# Patient Record
Sex: Female | Born: 1965
Health system: Southern US, Community
[De-identification: ages and names within clinical notes are randomized; demographics above are authoritative.]

## PROBLEM LIST (undated history)

## (undated) DIAGNOSIS — M722 Plantar fascial fibromatosis: Secondary | ICD-10-CM

## (undated) DIAGNOSIS — E119 Type 2 diabetes mellitus without complications: Secondary | ICD-10-CM

## (undated) DIAGNOSIS — E785 Hyperlipidemia, unspecified: Secondary | ICD-10-CM

## (undated) DIAGNOSIS — M109 Gout, unspecified: Secondary | ICD-10-CM

## (undated) DIAGNOSIS — I10 Essential (primary) hypertension: Secondary | ICD-10-CM

## (undated) HISTORY — PX: OTHER SURGICAL HISTORY: SHX169

## (undated) HISTORY — PX: TUBAL LIGATION: SHX77

## (undated) HISTORY — PX: LYMPH NODE DISSECTION: SHX5087

---

## 2013-09-08 ENCOUNTER — Encounter (HOSPITAL_BASED_OUTPATIENT_CLINIC_OR_DEPARTMENT_OTHER): Payer: Self-pay | Admitting: Emergency Medicine

## 2013-09-08 ENCOUNTER — Emergency Department (HOSPITAL_BASED_OUTPATIENT_CLINIC_OR_DEPARTMENT_OTHER)
Admission: EM | Admit: 2013-09-08 | Discharge: 2013-09-08 | Disposition: A | Discharge status: Home / Self Care | Payer: 59 | Attending: Emergency Medicine | Admitting: Emergency Medicine

## 2013-09-08 DIAGNOSIS — I10 Essential (primary) hypertension: Secondary | ICD-10-CM | POA: Diagnosis not present

## 2013-09-08 DIAGNOSIS — H9209 Otalgia, unspecified ear: Secondary | ICD-10-CM | POA: Diagnosis present

## 2013-09-08 DIAGNOSIS — R229 Localized swelling, mass and lump, unspecified: Secondary | ICD-10-CM | POA: Diagnosis not present

## 2013-09-08 HISTORY — DX: Essential (primary) hypertension: I10

## 2013-09-08 MED ORDER — IBUPROFEN 800 MG PO TABS
800.0000 mg | ORAL_TABLET | Freq: Once | ORAL | Status: AC
  Administered 2013-09-08: 800 mg via ORAL
  Filled 2013-09-08: qty 1

## 2013-09-08 MED ORDER — SULFAMETHOXAZOLE-TRIMETHOPRIM 800-160 MG PO TABS
1.0000 | ORAL_TABLET | Freq: Two times a day (BID) | ORAL | Status: DC
Start: 2013-09-08 — End: 2015-12-10

## 2013-09-08 MED ORDER — OXYCODONE-ACETAMINOPHEN 5-325 MG PO TABS
2.0000 | ORAL_TABLET | Freq: Once | ORAL | Status: AC
  Administered 2013-09-08: 2 via ORAL
  Filled 2013-09-08: qty 2

## 2013-09-08 MED ORDER — OXYCODONE-ACETAMINOPHEN 5-325 MG PO TABS: 2.0000 | ORAL_TABLET | Freq: Every evening | ORAL | Status: DC | PRN

## 2013-09-08 NOTE — ED Notes (Signed)
Pt states left ear pain since yesterday. Pt states she also has a swollen area behind her left ear that is painful to touch and the pain radiates to her jaw.

## 2013-09-08 NOTE — ED Provider Notes (Signed)
CSN: [id number]     Arrival date & time 09/08/13  2136 History   First MD Initiated Contact with Patient 09/08/13 2315     Chief Complaint  Patient presents with  . Otalgia     (Consider location/radiation/quality/duration/timing/severity/associated sxs/prior Treatment) HPI 48 year old female with 2 day history of gradual onset gradually worsening tender painful nodule behind her left ear with no trauma no hearing loss no rash no ear drainage no headache no neck pain no sore throat no confusion no treatment prior to arrival; pain was initially mild and is now moderately severe worse with palpation without radiation or associated symptoms. Past Medical History  Diagnosis Date  . Hypertension    Past Surgical History  Procedure Laterality Date  . Ablation     History reviewed. No pertinent family history. History  Substance Use Topics  . Smoking status: Never Smoker   . Smokeless tobacco: Not on file  . Alcohol Use: No   OB History   Grav Para Term Preterm Abortions TAB SAB Ect Mult Living                 Review of Systems 10 Systems reviewed and are negative for acute change except as noted in the HPI.   Allergies  Review of patient's allergies indicates no known allergies.  Home Medications   Prior to Admission medications   Medication Sig Start Date End Date Taking? Authorizing Provider  triamterene-hydrochlorothiazide (DYAZIDE) 50-25 MG per capsule Take 1 capsule by mouth every morning.   Yes Historical Provider, MD  oxyCODONE-acetaminophen (PERCOCET) 5-325 MG per tablet Take 2 tablets by mouth at bedtime as needed for severe pain. 09/08/13   Hurman HornJohn M Judah Chevere, MD  sulfamethoxazole-trimethoprim (BACTRIM DS,SEPTRA DS) 800-160 MG per tablet Take 1 tablet by mouth 2 (two) times daily. X 7 days 09/08/13   Hurman HornJohn M Ysidra Sopher, MD   BP 185/97  Pulse 93  Temp(Src) 98.3 F (36.8 C) (Oral)  Resp 16  Ht 5\' 4"  (1.626 m)  Wt 220 lb 3 oz (99.876 kg)  BMI 37.78 kg/m2  SpO2  97% Physical Exam  Nursing note and vitals reviewed. Constitutional:  Awake, alert, nontoxic appearance.  HENT:  Head: Atraumatic.  Right Ear: External ear normal.  Left Ear: External ear normal.  Mouth/Throat: Oropharynx is clear and moist. No oropharyngeal exudate.  Bilateral tympanic membranes appear normal external auditory canals are clear bilaterally bilateral ears are nontender left posterior auricular area just posterior to the inferior aspect of left ear reveals tender subcutaneous nodule firm approximately 1 cm diameter without overlying erythema or excessive warmth or fluctuance or purulence drainage to suggest abscess requiring incision and drainage at this time; mastoid region is nontender  Eyes: Right eye exhibits no discharge. Left eye exhibits no discharge.  Neck: Neck supple.  Cardiovascular: Normal rate and regular rhythm.   No murmur heard. Pulmonary/Chest: Effort normal and breath sounds normal. No respiratory distress. She has no wheezes. She has no rales. She exhibits no tenderness.  Abdominal: Soft. Bowel sounds are normal. She exhibits no distension. There is no tenderness. There is no rebound.  Musculoskeletal: She exhibits no tenderness.  Baseline ROM, no obvious new focal weakness.  Lymphadenopathy:    She has no cervical adenopathy.  Neurological: She is alert.  Mental status and motor strength appears baseline for patient and situation.  Skin: No rash noted.  Psychiatric: She has a normal mood and affect.    ED Course  Procedures (including critical care time) Patient informed of  clinical course, understand medical decision-making process, and agree with plan. Labs Review Labs Reviewed - No data to display  Imaging Review No results found.   EKG Interpretation None      MDM   Final diagnoses:  Skin nodule    I doubt any other EMC precluding discharge at this time including, but not necessarily limited to the following:mastoiditis,  meningitis, abscess needing emergent I&D.    Hurman HornJohn M Daiwik Buffalo, MD 09/10/13 [phone]

## 2013-09-08 NOTE — Discharge Instructions (Signed)
Return sooner if you develop fever, headache, confusion, stiff neck, rash, pus drainage, decreased hearing, or other concerns.

## 2013-09-08 NOTE — ED Notes (Signed)
C/o left ear pain

## 2013-09-08 NOTE — ED Notes (Addendum)
Pt offered ibuprofen in triage. Pt states she will wait until she gets to the back for the medication.

## 2014-08-11 ENCOUNTER — Encounter (HOSPITAL_BASED_OUTPATIENT_CLINIC_OR_DEPARTMENT_OTHER): Payer: Self-pay | Admitting: *Deleted

## 2014-08-11 ENCOUNTER — Emergency Department (HOSPITAL_BASED_OUTPATIENT_CLINIC_OR_DEPARTMENT_OTHER): Payer: 59

## 2014-08-11 ENCOUNTER — Emergency Department (HOSPITAL_BASED_OUTPATIENT_CLINIC_OR_DEPARTMENT_OTHER)
Admission: EM | Admit: 2014-08-11 | Discharge: 2014-08-11 | Disposition: A | Discharge status: Home / Self Care | Payer: 59 | Attending: Emergency Medicine | Admitting: Emergency Medicine

## 2014-08-11 DIAGNOSIS — M722 Plantar fascial fibromatosis: Secondary | ICD-10-CM | POA: Diagnosis not present

## 2014-08-11 DIAGNOSIS — I10 Essential (primary) hypertension: Secondary | ICD-10-CM | POA: Diagnosis not present

## 2014-08-11 DIAGNOSIS — Z792 Long term (current) use of antibiotics: Secondary | ICD-10-CM | POA: Diagnosis not present

## 2014-08-11 DIAGNOSIS — M79671 Pain in right foot: Secondary | ICD-10-CM | POA: Diagnosis present

## 2014-08-11 MED ORDER — IBUPROFEN 400 MG PO TABS
400.0000 mg | ORAL_TABLET | Freq: Once | ORAL | Status: AC
  Administered 2014-08-11: 400 mg via ORAL
  Filled 2014-08-11: qty 1

## 2014-08-11 NOTE — ED Notes (Addendum)
Reports right foot pain since this morning- denies injury- similar sx 3-4 years ago

## 2014-08-11 NOTE — ED Provider Notes (Signed)
CSN: [id number]     Arrival date & time 08/11/14  0857 History   First MD Initiated Contact with Patient 08/11/14 [phone]     Chief Complaint  Patient presents with  . Foot Pain     (Consider location/radiation/quality/duration/timing/severity/associated sxs/prior Treatment) HPI    Jessica Robertson is a 49 y.o. female complaining of severe pain to the sole of the left foot onset this morning when she woke up there was no trauma. She rates her pain at 10 out of 10, and is exacerbated by weightbearing. She is taking no pain medication prior to arrival. Patient is ambulatory at her baseline. That she had a similar incidence 3-4 years ago.   Past Medical History  Diagnosis Date  . Hypertension    Past Surgical History  Procedure Laterality Date  . Ablation    . Lymph node dissection    . Tubal ligation     No family history on file. History  Substance Use Topics  . Smoking status: Never Smoker   . Smokeless tobacco: Never Used  . Alcohol Use: No   OB History    No data available     Review of Systems  10 systems reviewed and found to be negative, except as noted in the HPI.   Allergies  Review of patient's allergies indicates no known allergies.  Home Medications   Prior to Admission medications   Medication Sig Start Date End Date Taking? Authorizing Provider  triamterene-hydrochlorothiazide (DYAZIDE) 50-25 MG per capsule Take 1 capsule by mouth every morning.   Yes Historical Provider, MD  oxyCODONE-acetaminophen (PERCOCET) 5-325 MG per tablet Take 2 tablets by mouth at bedtime as needed for severe pain. 09/08/13   Wayland SalinasJohn Bednar, MD  sulfamethoxazole-trimethoprim (BACTRIM DS,SEPTRA DS) 800-160 MG per tablet Take 1 tablet by mouth 2 (two) times daily. X 7 days 09/08/13   Wayland SalinasJohn Bednar, MD   BP 176/80 mmHg  Pulse 94  Temp(Src) 98.1 F (36.7 C) (Oral)  Resp 20  Ht 5\' 6"  (1.676 m)  Wt 230 lb (104.327 kg)  BMI 37.14 kg/m2  SpO2 97% Physical Exam  Constitutional: She is  oriented to person, place, and time. She appears well-developed and well-nourished. No distress.  HENT:  Head: Normocephalic.  Eyes: Conjunctivae and EOM are normal.  Cardiovascular: Normal rate.   Pulmonary/Chest: Effort normal. No stridor.  Musculoskeletal: Normal range of motion. She exhibits tenderness.  Patient is tender to palpation along the dorsum of the right foot. Neurovascularly intact, no deformity excellent range of motion to ankle and toes although it elicits pain.  Neurological: She is alert and oriented to person, place, and time.  Psychiatric: She has a normal mood and affect.  Nursing note and vitals reviewed.   ED Course  Procedures (including critical care time) Labs Review Labs Reviewed - No data to display  Imaging Review Dg Foot Complete Right  08/11/2014   CLINICAL DATA:  pain in the heel, anterior foot and in the ankle since this am with no injury  EXAM: RIGHT FOOT COMPLETE - 3+ VIEW  COMPARISON:  None.  FINDINGS: There is no evidence of fracture or dislocation. No significant osseous degenerative change. Accessory ossicles. There is no evidence of arthropathy or other focal bone abnormality. Soft tissues are unremarkable.  IMPRESSION: Negative.   Electronically Signed   By: Corlis Leak  Hassell M.D.   On: 08/11/2014 09:49     EKG Interpretation None      MDM   Final diagnoses:  Plantar fasciitis of  right foot    Filed Vitals:   08/11/14 0901  BP: 176/80  Pulse: 94  Temp: 98.1 F (36.7 C)  TempSrc: Oral  Resp: 20  Height:  (1.676 m)  Weight: 230 lb (104.327 kg)  SpO2: 97%    Medications  ibuprofen (ADVIL,MOTRIN) tablet 400 mg (not administered)    Jessica Robertson is a pleasant 49 y.o. female presenting with near atraumatic pain to dorsum of right foot, x-rays negative, patient is neurovascularly intact, pain is exacerbated by weightbearing. This is consistent with a plantar fasciitis. Advised patient to perform stretching exercises and NSAIDs for  their anti-inflammatory pain control properties. I've offered this patient crutches and she has declined. [facility] referral given.  Evaluation does not show pathology that would require ongoing emergent intervention or inpatient treatment. Pt is hemodynamically stable and mentating appropriately. Discussed findings and plan with patient/guardian, who agrees with care plan. All questions answered. Return precautions discussed and outpatient follow up given.      Wynetta Emery, PA-C 08/11/14 8119  Elwin Mocha, MD 08/11/14 [phone]

## 2014-08-11 NOTE — Discharge Instructions (Signed)
For pain control please take ibuprofen (also known as Motrin or Advil) 800mg  (this is normally 4 over the counter pills) 3 times a day  for 5 days. Take with food to minimize stomach irritation.  Please follow with your primary care doctor in the next 2 days for a check-up. They must obtain records for further management.   Do not hesitate to return to the Emergency Department for any new, worsening or concerning symptoms.    Plantar Fasciitis Plantar fasciitis is a common condition that causes foot pain. It is soreness (inflammation) of the band of tough fibrous tissue on the bottom of the foot that runs from the heel bone (calcaneus) to the ball of the foot. The cause of this soreness may be from excessive standing, poor fitting shoes, running on hard surfaces, being overweight, having an abnormal walk, or overuse (this is common in runners) of the painful foot or feet. It is also common in aerobic exercise dancers and ballet dancers. SYMPTOMS  Most people with plantar fasciitis complain of:  Severe pain in the morning on the bottom of their foot especially when taking the first steps out of bed. This pain recedes after a few minutes of walking.  Severe pain is experienced also during walking following a long period of inactivity.  Pain is worse when walking barefoot or up stairs DIAGNOSIS   Your caregiver will diagnose this condition by examining and feeling your foot.  Special tests such as X-rays of your foot, are usually not needed. PREVENTION   Consult a sports medicine professional before beginning a new exercise program.  Walking programs offer a good workout. With walking there is a lower chance of overuse injuries common to runners. There is less impact and less jarring of the joints.  Begin all new exercise programs slowly. If problems or pain develop, decrease the amount of time or distance until you are at a comfortable level.  Wear good shoes and replace them  regularly.  Stretch your foot and the heel cords at the back of the ankle (Achilles tendon) both before and after exercise.  Run or exercise on even surfaces that are not hard. For example, asphalt is better than pavement.  Do not run barefoot on hard surfaces.  If using a treadmill, vary the incline.  Do not continue to workout if you have foot or joint problems. Seek professional help if they do not improve. HOME CARE INSTRUCTIONS   Avoid activities that cause you pain until you recover.  Use ice or cold packs on the problem or painful areas after working out.  Only take over-the-counter or prescription medicines for pain, discomfort, or fever as directed by your caregiver.  Soft shoe inserts or athletic shoes with air or gel sole cushions may be helpful.  If problems continue or become more severe, consult a sports medicine caregiver or your own health care provider. Cortisone is a potent anti-inflammatory medication that may be injected into the painful area. You can discuss this treatment with your caregiver. MAKE SURE YOU:   Understand these instructions.  Will watch your condition.  Will get help right away if you are not doing well or get worse. Document Released: 01/04/2001 Document Revised: 07/04/2011 Document Reviewed: 03/05/2008 [facility] Patient Information 2015 [employer] [place]. This information is not intended to replace advice given to you by your health care provider. Make sure you discuss any questions you have with your health care provider.

## 2014-08-11 NOTE — ED Notes (Signed)
D/c with family- follow up care discussed and referral given for Dr Pearletha ForgeHudnall

## 2015-07-14 ENCOUNTER — Emergency Department (HOSPITAL_BASED_OUTPATIENT_CLINIC_OR_DEPARTMENT_OTHER)
Admission: EM | Admit: 2015-07-14 | Discharge: 2015-07-14 | Disposition: A | Discharge status: Home / Self Care | Payer: 59

## 2015-07-14 NOTE — ED Notes (Signed)
Patient called for triage x2, no answer °

## 2015-07-14 NOTE — ED Notes (Signed)
Patient called for triage x1, no answer. 

## 2015-11-30 ENCOUNTER — Emergency Department (HOSPITAL_BASED_OUTPATIENT_CLINIC_OR_DEPARTMENT_OTHER): Payer: Self-pay

## 2015-11-30 ENCOUNTER — Emergency Department (HOSPITAL_BASED_OUTPATIENT_CLINIC_OR_DEPARTMENT_OTHER)
Admission: EM | Admit: 2015-11-30 | Discharge: 2015-11-30 | Disposition: A | Discharge status: Home / Self Care | Payer: Self-pay | Attending: Emergency Medicine | Admitting: Emergency Medicine

## 2015-11-30 ENCOUNTER — Encounter (HOSPITAL_BASED_OUTPATIENT_CLINIC_OR_DEPARTMENT_OTHER): Payer: Self-pay | Admitting: Emergency Medicine

## 2015-11-30 DIAGNOSIS — I10 Essential (primary) hypertension: Secondary | ICD-10-CM | POA: Insufficient documentation

## 2015-11-30 DIAGNOSIS — M10071 Idiopathic gout, right ankle and foot: Secondary | ICD-10-CM | POA: Insufficient documentation

## 2015-11-30 DIAGNOSIS — M109 Gout, unspecified: Secondary | ICD-10-CM

## 2015-11-30 MED ORDER — NAPROXEN 250 MG PO TABS
500.0000 mg | ORAL_TABLET | Freq: Once | ORAL | Status: AC
  Administered 2015-11-30: 500 mg via ORAL
  Filled 2015-11-30: qty 2

## 2015-11-30 MED ORDER — NAPROXEN 500 MG PO TABS: 500.0000 mg | ORAL_TABLET | Freq: Two times a day (BID) | ORAL | 0 refills | Status: DC

## 2015-11-30 MED ORDER — PREDNISONE 20 MG PO TABS: 40.0000 mg | ORAL_TABLET | Freq: Every day | ORAL | 0 refills | Status: DC

## 2015-11-30 MED FILL — predniSONE 20 MG TABS: 20 | 5 days supply | Qty: 10 | Fill #0

## 2015-11-30 MED FILL — NAPROXEN 500 MG TABLET: 500 | 15 days supply | Qty: 30 | Fill #0

## 2015-11-30 NOTE — ED Triage Notes (Signed)
Patient states that she has a hx of plantar fascitis. The patient reports that "that pain went away" yesterday she woke up and her middle toe on her right swollen ( maybe multiple toes) the patient reports that on her left foot "my big toe is feeling some kinda way" When asked to elaborate was not really able.

## 2015-11-30 NOTE — ED Provider Notes (Signed)
[facility] Note   CSN: [id number] Arrival date & time: 11/30/15  1208  First Provider Contact:  First MD Initiated Contact with Patient 11/30/15 1250        History   Chief Complaint Chief Complaint  Patient presents with  . Foot Pain    HPI Jessica Robertson is a 50 y.o. female who presents with complaint complaint of foot pain. She is a past history of plantar fasciitis. Patient states that her symptoms have improved with anti-inflammatory medications, cryotherapy and new insoles. The patient states that she has pain, redness and swelling at the base of her second and third toes which developed yesterday morning when she woke up. She states that it has been worsening. She also complains that her right big toe feels like it needs to pop but developed. She denies fevers, chills, injury to the area, or history of gout. Patient states that she would like something for pain but does not take narcotics and does not like to take shots. She denies numbness, weakness or tingling  HPI  Past Medical History:  Diagnosis Date  . Hypertension     There are no active problems to display for this patient.   Past Surgical History:  Procedure Laterality Date  . ablation    . LYMPH NODE DISSECTION    . TUBAL LIGATION      OB History    No data available       Home Medications    Prior to Admission medications   Medication Sig Start Date End Date Taking? Authorizing Provider  naproxen (NAPROSYN) 500 MG tablet Take 1 tablet (500 mg total) by mouth 2 (two) times daily with a meal. 11/30/15   Arthor Captain, PA-C  predniSONE (DELTASONE) 20 MG tablet Take 2 tablets (40 mg total) by mouth daily. 11/30/15   Arthor Captain, PA-C  sulfamethoxazole-trimethoprim (BACTRIM DS,SEPTRA DS) 800-160 MG per tablet Take 1 tablet by mouth 2 (two) times daily. X 7 days 09/08/13   Wayland Salinas, MD  triamterene-hydrochlorothiazide (DYAZIDE) 50-25 MG per capsule Take 1 capsule by mouth every  morning.    Historical Provider, MD    Family History History reviewed. No pertinent family history.  Social History Social History  Substance Use Topics  . Smoking status: Never Smoker  . Smokeless tobacco: Never Used  . Alcohol use No     Allergies   Review of patient's allergies indicates no known allergies.   Review of Systems Review of Systems Ten systems reviewed and are negative for acute change, except as noted in the HPI.    Physical Exam Updated Vital Signs BP 157/89 (BP Location: Right Arm)   Pulse 89   Temp 98.1 F (36.7 C) (Oral)   Resp 18   Ht  (1.676 m)   Wt 99.8 kg   SpO2 100%   BMI 35.51 kg/m   Physical Exam Physical Exam  Nursing note and vitals reviewed. Constitutional: She is oriented to person, place, and time. She appears well-developed and well-nourished. No distress.  HENT:  Head: Normocephalic and atraumatic.  Eyes: Conjunctivae normal and EOM are normal. Pupils are equal, round, and reactive to light. No scleral icterus.  Neck: Normal range of motion.  Cardiovascular: Normal rate, regular rhythm and normal heart sounds.  Exam reveals no gallop and no friction rub.   No murmur heard. Pulmonary/Chest: Effort normal and breath sounds normal. No respiratory distress.  Abdominal: Soft. Bowel sounds are normal. She exhibits no distension and no  mass. There is no tenderness. There is no guarding.  Musculoskeletal: There is swelling, erythema and tenderness at the base of the second and third toes of the right foot, the digits are not swollen, no swelling in the foot or ankle, no calf tenderness or swelling. Strong DP and PT pulses bilaterally, NVI  Neurological: She is alert and oriented to person, place, and time.  Skin: Skin is warm and dry. She is not diaphoretic.     ED Treatments / Results  Labs (all labs ordered are listed, but only abnormal results are displayed) Labs Reviewed - No data to display  EKG  EKG  Interpretation None       Radiology Dg Foot Complete Right  Result Date: 11/30/2015 CLINICAL DATA:  Right foot pain, second toe pain for 2 days, no known injury EXAM: RIGHT FOOT COMPLETE - 3+ VIEW COMPARISON:  08/11/2014 FINDINGS: Three views of the right foot submitted. No acute fracture or subluxation. There is mild dorsal spurring navicular bone. Minimal spurring distal aspect first metatarsal. IMPRESSION: No acute fracture or subluxation.  Mild degenerative changes. Electronically Signed   By: Natasha MeadLiviu  Pop M.D.   On: 11/30/2015 13:17    Procedures Procedures (including critical care time)  Medications Ordered in ED Medications  naproxen (NAPROSYN) tablet 500 mg (500 mg Oral Given 11/30/15 1331)     Initial Impression / Assessment and Plan / ED Course  I have reviewed the triage vital signs and the nursing notes.  Pertinent labs & imaging results that were available during my care of the patient were reviewed by me and considered in my medical decision making (see chart for details).  Clinical Course    Pt presents with monoarticular pain, swelling and erythema.  Pt is afebrile and stable. Imaging reviewed, no evidence of occult fracture or injury.  Discussed that pt should respond to treatment with in 24 hour of begining treatment & likely resolve in 2-3 days.    Final Clinical Impressions(s) / ED Diagnoses   Final diagnoses:  Acute gout of right foot, unspecified cause    New Prescriptions Discharge Medication List as of 11/30/2015  1:30 PM    START taking these medications   Details  naproxen (NAPROSYN) 500 MG tablet Take 1 tablet (500 mg total) by mouth 2 (two) times daily with a meal., Starting Mon 11/30/2015, Print    predniSONE (DELTASONE) 20 MG tablet Take 2 tablets (40 mg total) by mouth daily., Starting Mon 11/30/2015, Print         WhitewoodAbigail Tyshika Baldridge, PA-C 11/30/15 1650    Geoffery Lyonsouglas Delo, MD 12/01/15 [phone]

## 2015-12-10 ENCOUNTER — Emergency Department (HOSPITAL_BASED_OUTPATIENT_CLINIC_OR_DEPARTMENT_OTHER): Payer: Self-pay

## 2015-12-10 ENCOUNTER — Encounter (HOSPITAL_BASED_OUTPATIENT_CLINIC_OR_DEPARTMENT_OTHER): Payer: Self-pay | Admitting: Emergency Medicine

## 2015-12-10 ENCOUNTER — Observation Stay (HOSPITAL_BASED_OUTPATIENT_CLINIC_OR_DEPARTMENT_OTHER)
Admission: EM | Admit: 2015-12-10 | Discharge: 2015-12-12 | Disposition: A | Discharge status: Home / Self Care | Payer: Self-pay | Attending: Surgery | Admitting: Surgery

## 2015-12-10 DIAGNOSIS — Z6837 Body mass index (BMI) 37.0-37.9, adult: Secondary | ICD-10-CM | POA: Insufficient documentation

## 2015-12-10 DIAGNOSIS — I10 Essential (primary) hypertension: Secondary | ICD-10-CM | POA: Diagnosis present

## 2015-12-10 DIAGNOSIS — K8000 Calculus of gallbladder with acute cholecystitis without obstruction: Secondary | ICD-10-CM | POA: Diagnosis present

## 2015-12-10 DIAGNOSIS — E785 Hyperlipidemia, unspecified: Secondary | ICD-10-CM | POA: Diagnosis present

## 2015-12-10 DIAGNOSIS — K219 Gastro-esophageal reflux disease without esophagitis: Secondary | ICD-10-CM | POA: Insufficient documentation

## 2015-12-10 DIAGNOSIS — K8010 Calculus of gallbladder with chronic cholecystitis without obstruction: Principal | ICD-10-CM | POA: Insufficient documentation

## 2015-12-10 DIAGNOSIS — Z419 Encounter for procedure for purposes other than remedying health state, unspecified: Secondary | ICD-10-CM

## 2015-12-10 DIAGNOSIS — R1013 Epigastric pain: Secondary | ICD-10-CM

## 2015-12-10 DIAGNOSIS — M109 Gout, unspecified: Secondary | ICD-10-CM | POA: Insufficient documentation

## 2015-12-10 HISTORY — DX: Hyperlipidemia, unspecified: E78.5

## 2015-12-10 HISTORY — DX: Gout, unspecified: M10.9

## 2015-12-10 HISTORY — DX: Plantar fascial fibromatosis: M72.2

## 2015-12-10 LAB — SURGICAL PCR SCREEN
MRSA, PCR: NEGATIVE
STAPHYLOCOCCUS AUREUS: NEGATIVE

## 2015-12-10 LAB — COMPREHENSIVE METABOLIC PANEL
ALK PHOS: 133 U/L — AB (ref 38–126)
ALT: 220 U/L — AB (ref 14–54)
AST: 63 U/L — AB (ref 15–41)
Albumin: 3.5 g/dL (ref 3.5–5.0)
Anion gap: 4 — ABNORMAL LOW (ref 5–15)
BUN: 12 mg/dL (ref 6–20)
CALCIUM: 9.6 mg/dL (ref 8.9–10.3)
CHLORIDE: 105 mmol/L (ref 101–111)
CO2: 29 mmol/L (ref 22–32)
Creatinine, Ser: 0.81 mg/dL (ref 0.44–1.00)
Glucose, Bld: 149 mg/dL — ABNORMAL HIGH (ref 65–99)
Potassium: 3.5 mmol/L (ref 3.5–5.1)
Sodium: 138 mmol/L (ref 135–145)
Total Bilirubin: 1 mg/dL (ref 0.3–1.2)
Total Protein: 7.2 g/dL (ref 6.5–8.1)

## 2015-12-10 LAB — URINE MICROSCOPIC-ADD ON

## 2015-12-10 LAB — CBC WITH DIFFERENTIAL/PLATELET
BASOS ABS: 0 10*3/uL (ref 0.0–0.1)
Basophils Relative: 0 %
Eosinophils Absolute: 0.1 10*3/uL (ref 0.0–0.7)
Eosinophils Relative: 2 %
HCT: 38.6 % (ref 36.0–46.0)
Hemoglobin: 12.8 g/dL (ref 12.0–15.0)
LYMPHS ABS: 1.5 10*3/uL (ref 0.7–4.0)
LYMPHS PCT: 27 %
MCH: 28.1 pg (ref 26.0–34.0)
MCHC: 33.2 g/dL (ref 30.0–36.0)
MCV: 84.8 fL (ref 78.0–100.0)
Monocytes Absolute: 0.6 10*3/uL (ref 0.1–1.0)
Monocytes Relative: 11 %
NEUTROS PCT: 59 %
Neutro Abs: 3.4 10*3/uL (ref 1.7–7.7)
Platelets: 237 10*3/uL (ref 150–400)
RBC: 4.55 MIL/uL (ref 3.87–5.11)
RDW: 14 % (ref 11.5–15.5)
WBC: 5.7 10*3/uL (ref 4.0–10.5)

## 2015-12-10 LAB — TROPONIN I: TROPONIN I: 0.03 ng/mL — AB (ref <0.03)

## 2015-12-10 LAB — URINALYSIS, ROUTINE W REFLEX MICROSCOPIC
BILIRUBIN URINE: NEGATIVE
Glucose, UA: NEGATIVE mg/dL
Ketones, ur: NEGATIVE mg/dL
Leukocytes, UA: NEGATIVE
Nitrite: NEGATIVE
Protein, ur: 100 mg/dL — AB
SPECIFIC GRAVITY, URINE: 1.023 (ref 1.005–1.030)
pH: 5.5 (ref 5.0–8.0)

## 2015-12-10 LAB — LIPASE, BLOOD: Lipase: 43 U/L (ref 11–51)

## 2015-12-10 MED ORDER — ONDANSETRON 4 MG PO TBDP
4.0000 mg | ORAL_TABLET | Freq: Four times a day (QID) | ORAL | Status: DC | PRN
  Administered 2015-12-10: 4 mg via ORAL
  Filled 2015-12-10: qty 1

## 2015-12-10 MED ORDER — MORPHINE SULFATE (PF) 4 MG/ML IV SOLN: 4.0000 mg | Freq: Once | INTRAVENOUS | Status: DC

## 2015-12-10 MED ORDER — KCL IN DEXTROSE-NACL 20-5-0.9 MEQ/L-%-% IV SOLN
INTRAVENOUS | Status: DC
  Administered 2015-12-10: 21:00:00 via INTRAVENOUS
  Filled 2015-12-10: qty 1000

## 2015-12-10 MED ORDER — ACETAMINOPHEN 325 MG PO TABS: 650.0000 mg | ORAL_TABLET | Freq: Four times a day (QID) | ORAL | Status: DC | PRN

## 2015-12-10 MED ORDER — HYDROMORPHONE HCL 1 MG/ML IJ SOLN
0.5000 mg | INTRAMUSCULAR | Status: DC | PRN
  Administered 2015-12-11 (×2): 0.5 mg via INTRAVENOUS
  Filled 2015-12-10 (×2): qty 1

## 2015-12-10 MED ORDER — DEXTROSE 5 % IV SOLN
2.0000 g | Freq: Once | INTRAVENOUS | Status: AC
  Administered 2015-12-10: 2 g via INTRAVENOUS
  Filled 2015-12-10: qty 2

## 2015-12-10 MED ORDER — ACETAMINOPHEN 650 MG RE SUPP: 650.0000 mg | Freq: Four times a day (QID) | RECTAL | Status: DC | PRN

## 2015-12-10 MED ORDER — DIPHENHYDRAMINE HCL 25 MG PO CAPS
25.0000 mg | ORAL_CAPSULE | Freq: Four times a day (QID) | ORAL | Status: DC | PRN
Start: 2015-12-10 — End: 2015-12-11

## 2015-12-10 MED ORDER — DIPHENHYDRAMINE HCL 50 MG/ML IJ SOLN: 25.0000 mg | Freq: Four times a day (QID) | INTRAMUSCULAR | Status: DC | PRN

## 2015-12-10 MED ORDER — ONDANSETRON HCL 4 MG/2ML IJ SOLN: 4.0000 mg | Freq: Four times a day (QID) | INTRAMUSCULAR | Status: DC | PRN

## 2015-12-10 MED ORDER — DEXTROSE 5 % IV SOLN: 2.0000 g | INTRAVENOUS | Status: DC

## 2015-12-10 MED ORDER — OXYCODONE-ACETAMINOPHEN 5-325 MG PO TABS
1.0000 | ORAL_TABLET | ORAL | Status: DC | PRN
  Administered 2015-12-10: 2 via ORAL
  Filled 2015-12-10: qty 2

## 2015-12-10 MED ORDER — ONDANSETRON HCL 4 MG/2ML IJ SOLN: 4.0000 mg | Freq: Once | INTRAMUSCULAR | Status: DC

## 2015-12-10 MED ORDER — DEXTROSE 5 % IV SOLN
2.0000 g | INTRAVENOUS | Status: DC
  Filled 2015-12-10: qty 2

## 2015-12-10 MED ORDER — HEPARIN SODIUM (PORCINE) 5000 UNIT/ML IJ SOLN
5000.0000 [IU] | Freq: Three times a day (TID) | INTRAMUSCULAR | Status: DC
  Filled 2015-12-10 (×3): qty 1

## 2015-12-10 NOTE — ED Notes (Signed)
[facility] c/o abd discomfort/pressure, 22 gauge NSL to Rt hand, per [facility] 2 gm Rocephin was given and patient has been NPO since last night, NKA and hx of HTN and Hper lipidemia

## 2015-12-10 NOTE — ED Notes (Signed)
Pt states, "I feel like my food gets stuck."

## 2015-12-10 NOTE — ED Notes (Signed)
PT CAN GO TO FLOOR 13:22

## 2015-12-10 NOTE — ED Notes (Signed)
Attempted IV in left FA and left hand unsuccessful. Able to collect labs from left hand. Pt states she doesn't want to be stuck any more. Pt states she is not currently having pain.

## 2015-12-10 NOTE — H&P (Signed)
Jessica Robertson is an 50 y.o. female.   PCP: Aleen Campi, NP Chief Complaint: Epigastric pain worse after eating 3 days HPI: Patient presented to [facility] early this morning with 3 days of epigastric pain worse after eating and lying down. She reports being up all night because of the discomfort. She has had some nausea and has vomited once. Symptoms started Monday at [facility], she was unable to complete her meal. She continued to have pain on Tuesday. It was worse after eating popcorn. On Wednesday she ate a hamburger and did well until around 5 AM this morning the symptoms reoccurred. She's been taking Zantac and treating this is reflux. She vomited once on Wednesday evening after the popcorn. Epigastric pain is her main symptom.  Workup at [facility] shows she is afebrile initial blood pressure was elevated but the remaining blood pressures are stable. Labs show a normal CBC. WBC 5.7. Troponin 0.03. CMP shows alkaline phosphatase slightly elevated at 133 AST is 63, ALT is 220 total bilirubin is 1. Lipase was 43. Abdominal ultrasound shows: Gallbladder is partially distended there are multiple echogenic shadows there is mild gallbladder wall thickening and a positive Murphy sign. The common bile duct is 2 mm. She has some fatty infiltrative changes of the liver. There are impression this was consistent with cholelithiasis and possible acute cholecystitis. We are asked to see, and she was transported to the emergency department at [facility].  Past Medical History:  Diagnosis Date   GERD   . Gout   . Hyperlipemia   . Hypertension   . Plantar fasciitis     Past Surgical History:  Procedure Laterality Date  . Uterine ablation    . LYMPH NODE DISSECTION    . TUBAL LIGATION      No family history on file. Social History:  reports that she has never smoked. She has never used smokeless tobacco. She reports that she does not drink alcohol or use drugs.  Allergies:  No Known Allergies  Prior to Admission medications   Medication Sig Start Date End Date Taking? Authorizing Provider  amLODipine (NORVASC) 5 MG tablet Take 5 mg by mouth daily. 12/01/15  Yes Historical Provider, MD  naproxen (NAPROSYN) 500 MG tablet Take 1 tablet (500 mg total) by mouth 2 (two) times daily with a meal. Patient not taking: Reported on 12/10/2015 11/30/15 Taking when necessary for gout symptoms   Margarita Mail, PA-C  predniSONE (DELTASONE) 20 MG tablet Take 2 tablets (40 mg total) by mouth daily. Patient not taking: Reported on 12/10/2015 11/30/15 Not taking   Margarita Mail, PA-C     Results for orders placed or performed during the hospital encounter of 12/10/15 (from the past 48 hour(s))  Troponin I     Status: Abnormal   Collection Time: 12/10/15  6:55 AM  Result Value Ref Range   Troponin I 0.03 (HH) <0.03 ng/mL    Comment: CRITICAL RESULT CALLED TO, READ BACK BY AND VERIFIED WITH: CALLED TO B.BROOKS RN AT 0732 BY S.ROY ON [id number]   CBC with Differential     Status: None   Collection Time: 12/10/15  6:55 AM  Result Value Ref Range   WBC 5.7 4.0 - 10.5 K/uL   RBC 4.55 3.87 - 5.11 MIL/uL   Hemoglobin 12.8 12.0 - 15.0 g/dL   HCT 38.6 36.0 - 46.0 %   MCV 84.8 78.0 - 100.0 fL   MCH 28.1 26.0 - 34.0 pg  MCHC 33.2 30.0 - 36.0 g/dL   RDW 47.8 41.2 - 82.0 %   Platelets 237 150 - 400 K/uL   Neutrophils Relative % 59 %   Neutro Abs 3.4 1.7 - 7.7 K/uL   Lymphocytes Relative 27 %   Lymphs Abs 1.5 0.7 - 4.0 K/uL   Monocytes Relative 11 %   Monocytes Absolute 0.6 0.1 - 1.0 K/uL   Eosinophils Relative 2 %   Eosinophils Absolute 0.1 0.0 - 0.7 K/uL   Basophils Relative 0 %   Basophils Absolute 0.0 0.0 - 0.1 K/uL  Comprehensive metabolic panel     Status: Abnormal   Collection Time: 12/10/15  7:38 AM  Result Value Ref Range   Sodium 138 135 - 145 mmol/L   Potassium 3.5 3.5 - 5.1 mmol/L   Chloride 105 101 - 111 mmol/L   CO2 29 22 - 32 mmol/L   Glucose, Bld 149 (H) 65 - 99  mg/dL   BUN 12 6 - 20 mg/dL   Creatinine, Ser 8.13 0.44 - 1.00 mg/dL   Calcium 9.6 8.9 - 88.7 mg/dL   Total Protein 7.2 6.5 - 8.1 g/dL   Albumin 3.5 3.5 - 5.0 g/dL   AST 63 (H) 15 - 41 U/L   ALT 220 (H) 14 - 54 U/L   Alkaline Phosphatase 133 (H) 38 - 126 U/L   Total Bilirubin 1.0 0.3 - 1.2 mg/dL   GFR calc non Af Amer >60 >60 mL/min   GFR calc Af Amer >60 >60 mL/min    Comment: (NOTE) The eGFR has been calculated using the CKD EPI equation. This calculation has not been validated in all clinical situations. eGFR's persistently <60 mL/min signify possible Chronic Kidney Disease.    Anion gap 4 (L) 5 - 15  Lipase, blood     Status: None   Collection Time: 12/10/15  7:38 AM  Result Value Ref Range   Lipase 43 11 - 51 U/L   US Abdomen Complete  Result Date: 12/10/2015 CLINICAL DATA:  Epigastric pain and nausea for the past 4 days. EXAM: ABDOMEN ULTRASOUND COMPLETE COMPARISON:  None in PACs FINDINGS: Gallbladder: The gallbladder is only partially distended. There are multiple echogenic mobile shadowing stones. There is mild gallbladder wall thickening to 4 mm. There is a positive sonographic Murphy's sign. Common bile duct: Diameter: 2 mm Liver: The hepatic echotexture is mildly increased diffusely. There is no focal mass nor ductal dilation. IVC: Bowel gas limits evaluation of the inferior vena cava. Pancreas: Bowel gas limits evaluation of the pancreatic head and tail. Spleen: Size and appearance within normal limits. Right Kidney: Length: 12 cm. Echogenicity within normal limits. No mass or hydronephrosis visualized. Left Kidney: Length: 12.2 cm. Echogenicity within normal limits. No mass or hydronephrosis visualized. Abdominal aorta: No aneurysm visualized. Other findings: There is no ascites. IMPRESSION: 1. Gallstones with findings suspicious for acute cholecystitis. 2. Fatty infiltrative change of the liver. Electronically Signed   By: David  [place] M.D.   On: 12/10/2015 08:29    Review  of Systems  Constitutional: Negative.   HENT: Negative.   Eyes: Negative.   Respiratory: Negative.   Cardiovascular: Negative.   Gastrointestinal: Positive for abdominal pain (Pain feels like GERD sx), constipation (Some, took a laxative yesterday.), heartburn, nausea and vomiting ( x 1). Negative for diarrhea.  Genitourinary: Negative.   Musculoskeletal: Negative.   Skin: Negative.   Neurological: Negative.   Endo/Heme/Allergies: Negative.   Psychiatric/Behavioral: Negative.     Blood pressure 136/71,  pulse 85, temperature 98.6 F (37 C), temperature source Oral, resp. rate 18, height '5\' 5"'$  (1.651 m), weight 99.8 kg (220 lb), SpO2 95 %. Physical Exam  Constitutional: She is oriented to person, place, and time. She appears well-developed and well-nourished. No distress.  HENT:  Head: Normocephalic.  Mouth/Throat: No oropharyngeal exudate.  Eyes: Right eye exhibits no discharge. Left eye exhibits no discharge. No scleral icterus.  Neck: Normal range of motion. Neck supple. No JVD present. No tracheal deviation present. No thyromegaly present.  Cardiovascular: Normal rate, regular rhythm, normal heart sounds and intact distal pulses.   No murmur heard. Respiratory: Effort normal and breath sounds normal. No respiratory distress. She has no wheezes. She has no rales. She exhibits no tenderness.  GI: Soft. Bowel sounds are normal. She exhibits no distension and no mass. There is tenderness (Right upper quadrant on palpation). There is no rebound and no guarding.  Small scar at the umbilicus from prior procedures.  Musculoskeletal: She exhibits no edema or tenderness.  Lymphadenopathy:    She has no cervical adenopathy.  Neurological: She is alert and oriented to person, place, and time. No cranial nerve deficit.  Skin: Skin is warm and dry. No rash noted. She is not diaphoretic. No erythema. No pallor.  Psychiatric: She has a normal mood and affect. Her behavior is normal. Judgment and  thought content normal.     Assessment/Plan Cholelithiasis with cholecystitis. GERD Hypertension Gout Obesity  Plan: Admit, patient insisted on eating saw give her a low-fat diet for today. She has had one dose of Rocephin will maintain that for now every 24 hours. IV fluid hydration, nothing by mouth and probable cholecystectomy in the a.m.   Cleta Heatley, PA-C 12/10/2015, 10:50 AM

## 2015-12-10 NOTE — Progress Notes (Signed)
Pt is asking if there is any chance to go to surgery any earlier than the 3pm time to please push it up

## 2015-12-10 NOTE — ED Provider Notes (Signed)
[facility] Provider Note   CSN: [id number] Arrival date & time: 12/10/15  0556     History   Chief Complaint Chief Complaint  Patient presents with  . Abdominal Pain    HPI Jessica Robertson is a 50 y.o. female.  The history is provided by the patient.  She has a history of hypertension, hyperlipidemia, gout. For the last 3 days, she has been having epigastric pain which is worse after eating and worse with laying down. She has been up all night pacing the floor because of the pain. There has been associated nausea but she did not vomit until last night when she vomited once. There was some modest improvement in pain following vomiting. There is some radiation of the chest, no radiation to the back. She denies fever, chills, sweats. There is no dyspnea or diaphoresis. She has been taking over-the-counter ranitidine without relief. She does relate that she was told she had acid reflux but has never been on any medication for it.  Past Medical History:  Diagnosis Date  . Gout   . Hyperlipemia   . Hypertension   . Plantar fasciitis     There are no active problems to display for this patient.   Past Surgical History:  Procedure Laterality Date  . ablation    . LYMPH NODE DISSECTION    . TUBAL LIGATION      OB History    No data available       Home Medications    Prior to Admission medications   Medication Sig Start Date End Date Taking? Authorizing Provider  naproxen (NAPROSYN) 500 MG tablet Take 1 tablet (500 mg total) by mouth 2 (two) times daily with a meal. 11/30/15  Yes Arthor CaptainAbigail Harris, PA-C  predniSONE (DELTASONE) 20 MG tablet Take 2 tablets (40 mg total) by mouth daily. 11/30/15   Arthor CaptainAbigail Harris, PA-C  sulfamethoxazole-trimethoprim (BACTRIM DS,SEPTRA DS) 800-160 MG per tablet Take 1 tablet by mouth 2 (two) times daily. X 7 days 09/08/13   Wayland SalinasJohn Bednar, MD  triamterene-hydrochlorothiazide (DYAZIDE) 50-25 MG per capsule Take 1 capsule by mouth every morning.     Historical Provider, MD    Family History No family history on file.  Social History Social History  Substance Use Topics  . Smoking status: Never Smoker  . Smokeless tobacco: Never Used  . Alcohol use No     Allergies   Review of patient's allergies indicates no known allergies.   Review of Systems Review of Systems  All other systems reviewed and are negative.    Physical Exam Updated Vital Signs BP 174/90 (BP Location: Left Arm)   Pulse 88   Temp 98.6 F (37 C) (Oral)   Resp 20   Ht 5\' 5"  (1.651 m)   Wt 220 lb (99.8 kg)   SpO2 98%   BMI 36.61 kg/m   Physical Exam  Nursing note and vitals reviewed.  50 year old female, resting comfortably and in no acute distress. Vital signs are significant for hypertension. Oxygen saturation is 98%, which is normal. Head is normocephalic and atraumatic. PERRLA, EOMI. Oropharynx is clear. Neck is nontender and supple without adenopathy or JVD. Back is nontender and there is no CVA tenderness. Lungs are clear without rales, wheezes, or rhonchi. Chest is nontender. Heart has regular rate and rhythm without murmur. Abdomen is soft, flat, with moderate epigastric tenderness. There is mild right upper quadrant tenderness with negative Murphy sign. There are no masses or hepatosplenomegaly and peristalsis  is hypoactive. Extremities have no cyanosis or edema, full range of motion is present. Skin is warm and dry without rash. Neurologic: Mental status is normal, cranial nerves are intact, there are no motor or sensory deficits.  ED Treatments / Results  Labs (all labs ordered are listed, but only abnormal results are displayed) Labs Reviewed  TROPONIN I - Abnormal; Notable for the following:       Result Value   Troponin I 0.03 (*)    All other components within normal limits  CBC WITH DIFFERENTIAL/PLATELET  COMPREHENSIVE METABOLIC PANEL  LIPASE, BLOOD    EKG  EKG Interpretation  Date/Time:  Thursday December 10 2015  06:09:48 EDT Ventricular Rate:  81 PR Interval:    QRS Duration: 100 QT Interval:  393 QTC Calculation: 457 R Axis:   25 Text Interpretation:  Sinus rhythm Left ventricular hypertrophy No significant change was found Confirmed by [facility]  MD, Sujay Grundman ([id number]) on 12/10/2015 6:17:13 AM       Procedures Procedures (including critical care time)  Medications Ordered in ED Medications  ondansetron (ZOFRAN) injection 4 mg (4 mg Intravenous Not Given 12/10/15 0659)  morphine 4 MG/ML injection 4 mg (4 mg Intravenous Not Given 12/10/15 0659)     Initial Impression / Assessment and Plan / ED Course  I have reviewed the triage vital signs and the nursing notes.  Pertinent labs that were available during my care of the patient were reviewed by me and considered in my medical decision making (see chart for details).  Clinical Course    Epigastric pain which may be GERD, consider possibility of biliary colic. Consider pancreatitis. Screening labs are obtained including lipase and she will be sent for abdominal ultrasound. Old records are reviewed, and she has no relevant past visits.  Troponin is come back borderline and will need to get follow-up troponin. Ultrasound is pending as are his competence of metabolic panel and lipase. Case is signed out to Dr. Clarene DukeLittle to evaluate results of those tests. If negative, anticipate sending her home on a proton pump inhibitor.  Final Clinical Impressions(s) / ED Diagnoses   Final diagnoses:  Epigastric pain    New Prescriptions New Prescriptions   No medications on file     Dione Boozeavid Bridney Guadarrama, MD 12/10/15 [phone]

## 2015-12-10 NOTE — ED Notes (Signed)
Dr Juleen ChinaKohut notified [facility]

## 2015-12-10 NOTE — ED Notes (Signed)
Secretary paged Dr. Daphine DeutscherMartin, surgeon

## 2015-12-10 NOTE — ED Notes (Signed)
Spoke with Johnny BridgeMartha, Consulting civil engineerCharge RN at [facility]

## 2015-12-10 NOTE — ED Provider Notes (Signed)
[facility] She is a transfer from [facility] for possible cholecystitis. She has no new complaints since last evaluated. She is declining any pain medication. She currently has no questions. She is awaiting general surgery consultation. Surgery has been paged.     Raeford RazorStephen Ramez Arrona, MD 12/10/15 1054

## 2015-12-10 NOTE — ED Triage Notes (Signed)
Pt c/o epigastric pain intermittent since mon. Pt states eating seems to aggravate symptoms.

## 2015-12-10 NOTE — ED Provider Notes (Signed)
I received this patient in signout from Dr. Preston FleetingGlick. We were awaiting US and lab results. Labs show elevated LFTs with AST 63,  ALT 220. Ultrasound shows multiple gallstones within the gallbladder, mild gallbladder wall thickening and distention concerning for early cholecystitis given the lab results. I discussed with general surgery, Dr. Daphine DeutscherMartin, who [facility]. Discussed with ED physician, Dr. Juleen ChinaKohut, who has accepted pt for ED to ED transfer. Pt transferred in stable condition.   Laurence Spatesachel Morgan Little, MD 12/10/15 [phone]

## 2015-12-11 ENCOUNTER — Encounter (HOSPITAL_COMMUNITY)
Admission: EM | Disposition: A | Discharge status: Home / Self Care | Payer: Self-pay | Source: Home / Self Care | Attending: Emergency Medicine

## 2015-12-11 ENCOUNTER — Observation Stay (HOSPITAL_COMMUNITY): Payer: Self-pay | Admitting: Registered Nurse

## 2015-12-11 ENCOUNTER — Encounter (HOSPITAL_COMMUNITY): Payer: Self-pay

## 2015-12-11 ENCOUNTER — Observation Stay (HOSPITAL_COMMUNITY): Payer: Self-pay

## 2015-12-11 HISTORY — PX: CHOLECYSTECTOMY: SHX55

## 2015-12-11 LAB — COMPREHENSIVE METABOLIC PANEL
ALT: 146 U/L — AB (ref 14–54)
AST: 46 U/L — AB (ref 15–41)
Albumin: 3.5 g/dL (ref 3.5–5.0)
Alkaline Phosphatase: 122 U/L (ref 38–126)
Anion gap: 5 (ref 5–15)
BILIRUBIN TOTAL: 0.9 mg/dL (ref 0.3–1.2)
BUN: 10 mg/dL (ref 6–20)
CALCIUM: 9.5 mg/dL (ref 8.9–10.3)
CO2: 28 mmol/L (ref 22–32)
CREATININE: 0.88 mg/dL (ref 0.44–1.00)
Chloride: 105 mmol/L (ref 101–111)
GFR calc Af Amer: >60 mL/min (ref >60)
Glucose, Bld: 148 mg/dL — ABNORMAL HIGH (ref 65–99)
Potassium: 6.1 mmol/L — ABNORMAL HIGH (ref 3.5–5.1)
Sodium: 138 mmol/L (ref 135–145)
TOTAL PROTEIN: 7.1 g/dL (ref 6.5–8.1)

## 2015-12-11 LAB — CBC
HCT: 38.4 % (ref 36.0–46.0)
Hemoglobin: 12.1 g/dL (ref 12.0–15.0)
MCH: 27.4 pg (ref 26.0–34.0)
MCHC: 31.5 g/dL (ref 30.0–36.0)
MCV: 86.9 fL (ref 78.0–100.0)
PLATELETS: 215 10*3/uL (ref 150–400)
RBC: 4.42 MIL/uL (ref 3.87–5.11)
RDW: 14.3 % (ref 11.5–15.5)
WBC: 5.2 10*3/uL (ref 4.0–10.5)

## 2015-12-11 LAB — BASIC METABOLIC PANEL
Anion gap: 6 (ref 5–15)
BUN: 10 mg/dL (ref 6–20)
CHLORIDE: 105 mmol/L (ref 101–111)
CO2: 27 mmol/L (ref 22–32)
CREATININE: 0.82 mg/dL (ref 0.44–1.00)
Calcium: 9.4 mg/dL (ref 8.9–10.3)
GFR calc Af Amer: >60 mL/min (ref >60)
Glucose, Bld: 138 mg/dL — ABNORMAL HIGH (ref 65–99)
Potassium: 4.1 mmol/L (ref 3.5–5.1)
SODIUM: 138 mmol/L (ref 135–145)

## 2015-12-11 LAB — PROTIME-INR
INR: 0.89
PROTHROMBIN TIME: 12 s (ref 11.4–15.2)

## 2015-12-11 SURGERY — LAPAROSCOPIC CHOLECYSTECTOMY WITH INTRAOPERATIVE CHOLANGIOGRAM
Anesthesia: General | Site: Abdomen

## 2015-12-11 MED ORDER — ONDANSETRON HCL 4 MG/2ML IJ SOLN
INTRAMUSCULAR | Status: DC | PRN
  Administered 2015-12-11: 4 mg via INTRAVENOUS

## 2015-12-11 MED ORDER — DEXAMETHASONE SODIUM PHOSPHATE 10 MG/ML IJ SOLN
INTRAMUSCULAR | Status: AC
  Filled 2015-12-11: qty 1

## 2015-12-11 MED ORDER — ROCURONIUM BROMIDE 100 MG/10ML IV SOLN
INTRAVENOUS | Status: DC | PRN
  Administered 2015-12-11: 40 mg via INTRAVENOUS
  Administered 2015-12-11: 10 mg via INTRAVENOUS

## 2015-12-11 MED ORDER — ONDANSETRON HCL 4 MG/2ML IJ SOLN: 4.0000 mg | Freq: Four times a day (QID) | INTRAMUSCULAR | Status: DC | PRN

## 2015-12-11 MED ORDER — LIDOCAINE HCL (CARDIAC) 20 MG/ML IV SOLN
INTRAVENOUS | Status: DC | PRN
  Administered 2015-12-11: 100 mg via INTRAVENOUS

## 2015-12-11 MED ORDER — SUGAMMADEX SODIUM 200 MG/2ML IV SOLN
INTRAVENOUS | Status: DC | PRN
  Administered 2015-12-11: 200 mg via INTRAVENOUS

## 2015-12-11 MED ORDER — BUPIVACAINE LIPOSOME 1.3 % IJ SUSP
INTRAMUSCULAR | Status: DC | PRN
  Administered 2015-12-11: 20 mL

## 2015-12-11 MED ORDER — MIDAZOLAM HCL 2 MG/2ML IJ SOLN
INTRAMUSCULAR | Status: AC
  Filled 2015-12-11: qty 2

## 2015-12-11 MED ORDER — IOPAMIDOL (ISOVUE-300) INJECTION 61%
INTRAVENOUS | Status: AC
  Filled 2015-12-11: qty 50

## 2015-12-11 MED ORDER — LIDOCAINE HCL (CARDIAC) 20 MG/ML IV SOLN
INTRAVENOUS | Status: AC
  Filled 2015-12-11: qty 5

## 2015-12-11 MED ORDER — SUCCINYLCHOLINE CHLORIDE 20 MG/ML IJ SOLN
INTRAMUSCULAR | Status: DC | PRN
  Administered 2015-12-11: 100 mg via INTRAVENOUS

## 2015-12-11 MED ORDER — PROPOFOL 10 MG/ML IV BOLUS
INTRAVENOUS | Status: AC
  Filled 2015-12-11: qty 20

## 2015-12-11 MED ORDER — KCL IN DEXTROSE-NACL 20-5-0.45 MEQ/L-%-% IV SOLN
INTRAVENOUS | Status: DC
  Administered 2015-12-11: 14:00:00 via INTRAVENOUS
  Administered 2015-12-12: 1000 mL via INTRAVENOUS
  Filled 2015-12-11 (×3): qty 1000

## 2015-12-11 MED ORDER — DEXAMETHASONE SODIUM PHOSPHATE 10 MG/ML IJ SOLN
INTRAMUSCULAR | Status: DC | PRN
  Administered 2015-12-11: 10 mg via INTRAVENOUS

## 2015-12-11 MED ORDER — SUGAMMADEX SODIUM 200 MG/2ML IV SOLN
INTRAVENOUS | Status: AC
  Filled 2015-12-11: qty 2

## 2015-12-11 MED ORDER — PROPOFOL 10 MG/ML IV BOLUS
INTRAVENOUS | Status: DC | PRN
  Administered 2015-12-11: 200 mg via INTRAVENOUS

## 2015-12-11 MED ORDER — FENTANYL CITRATE (PF) 100 MCG/2ML IJ SOLN
INTRAMUSCULAR | Status: AC
  Filled 2015-12-11: qty 4

## 2015-12-11 MED ORDER — BUPIVACAINE LIPOSOME 1.3 % IJ SUSP
20.0000 mL | Freq: Once | INTRAMUSCULAR | Status: DC
  Filled 2015-12-11: qty 20

## 2015-12-11 MED ORDER — AMLODIPINE BESYLATE 5 MG PO TABS
5.0000 mg | ORAL_TABLET | Freq: Every day | ORAL | Status: DC
  Administered 2015-12-11 – 2015-12-12 (×2): 5 mg via ORAL
  Filled 2015-12-11 (×2): qty 1

## 2015-12-11 MED ORDER — FENTANYL CITRATE (PF) 100 MCG/2ML IJ SOLN
INTRAMUSCULAR | Status: DC | PRN
  Administered 2015-12-11 (×3): 50 ug via INTRAVENOUS

## 2015-12-11 MED ORDER — MIDAZOLAM HCL 5 MG/5ML IJ SOLN
INTRAMUSCULAR | Status: DC | PRN
  Administered 2015-12-11: 2 mg via INTRAVENOUS

## 2015-12-11 MED ORDER — DEXTROSE 5 % IV SOLN
INTRAVENOUS | Status: AC
  Filled 2015-12-11: qty 2

## 2015-12-11 MED ORDER — ONDANSETRON 4 MG PO TBDP: 4.0000 mg | ORAL_TABLET | Freq: Four times a day (QID) | ORAL | Status: DC | PRN

## 2015-12-11 MED ORDER — HEPARIN SODIUM (PORCINE) 5000 UNIT/ML IJ SOLN
5000.0000 [IU] | Freq: Three times a day (TID) | INTRAMUSCULAR | Status: DC
  Filled 2015-12-11: qty 1

## 2015-12-11 MED ORDER — ONDANSETRON HCL 4 MG/2ML IJ SOLN
INTRAMUSCULAR | Status: AC
  Filled 2015-12-11: qty 2

## 2015-12-11 MED ORDER — IOPAMIDOL (ISOVUE-300) INJECTION 61%
INTRAVENOUS | Status: DC | PRN
  Administered 2015-12-11: 10 mL

## 2015-12-11 MED ORDER — DEXTROSE 5 % IV SOLN
2.0000 g | Freq: Once | INTRAVENOUS | Status: AC
  Administered 2015-12-11: 2 g via INTRAVENOUS

## 2015-12-11 MED ORDER — ALUM & MAG HYDROXIDE-SIMETH 200-200-20 MG/5ML PO SUSP
15.0000 mL | ORAL | Status: DC | PRN
  Administered 2015-12-11: 15 mL via ORAL
  Filled 2015-12-11: qty 30

## 2015-12-11 MED ORDER — LACTATED RINGERS IV SOLN
INTRAVENOUS | Status: DC
  Administered 2015-12-11 (×2): 1000 mL via INTRAVENOUS

## 2015-12-11 MED ORDER — MORPHINE SULFATE (PF) 10 MG/ML IV SOLN
1.0000 mg | INTRAVENOUS | Status: DC | PRN
  Administered 2015-12-11 – 2015-12-12 (×2): 1 mg via INTRAVENOUS
  Filled 2015-12-11 (×2): qty 1

## 2015-12-11 MED ORDER — HYDROMORPHONE HCL 1 MG/ML IJ SOLN: 0.2500 mg | INTRAMUSCULAR | Status: DC | PRN

## 2015-12-11 MED ORDER — PHENYLEPHRINE 40 MCG/ML (10ML) SYRINGE FOR IV PUSH (FOR BLOOD PRESSURE SUPPORT)
PREFILLED_SYRINGE | INTRAVENOUS | Status: AC
  Filled 2015-12-11: qty 10

## 2015-12-11 MED ORDER — SODIUM CHLORIDE 0.9 % IV SOLN: INTRAVENOUS | Status: DC

## 2015-12-11 MED ORDER — PHENYLEPHRINE HCL 10 MG/ML IJ SOLN
INTRAMUSCULAR | Status: DC | PRN
  Administered 2015-12-11: 40 ug via INTRAVENOUS

## 2015-12-11 MED ORDER — HYDROCODONE-ACETAMINOPHEN 5-325 MG PO TABS: 1.0000 | ORAL_TABLET | ORAL | Status: DC | PRN

## 2015-12-11 SURGICAL SUPPLY — 37 items
APPLICATOR COTTON TIP 6IN STRL (MISCELLANEOUS) ×6 IMPLANT
APPLIER CLIP ROT 10 11.4 M/L (STAPLE) ×3
BENZOIN TINCTURE PRP APPL 2/3 (GAUZE/BANDAGES/DRESSINGS) IMPLANT
CABLE HIGH FREQUENCY MONO STRZ (ELECTRODE) ×3 IMPLANT
CATH REDDICK CHOLANGI 4FR 50CM (CATHETERS) ×3 IMPLANT
CLIP APPLIE ROT 10 11.4 M/L (STAPLE) ×1 IMPLANT
CLOSURE WOUND 1/2 X4 (GAUZE/BANDAGES/DRESSINGS)
COVER MAYO STAND STRL (DRAPES) ×3 IMPLANT
COVER SURGICAL LIGHT HANDLE (MISCELLANEOUS) ×3 IMPLANT
DECANTER SPIKE VIAL GLASS SM (MISCELLANEOUS) ×3 IMPLANT
DRAPE C-ARM 42X120 X-RAY (DRAPES) ×3 IMPLANT
ELECT PENCIL ROCKER SW 15FT (MISCELLANEOUS) ×3 IMPLANT
ELECT REM PT RETURN 9FT ADLT (ELECTROSURGICAL) ×3
ELECTRODE REM PT RTRN 9FT ADLT (ELECTROSURGICAL) ×1 IMPLANT
GLOVE BIOGEL M 8.0 STRL (GLOVE) ×3 IMPLANT
GOWN STRL REUS W/TWL XL LVL3 (GOWN DISPOSABLE) ×9 IMPLANT
HEMOSTAT SURGICEL 4X8 (HEMOSTASIS) IMPLANT
IRRIG SUCT STRYKERFLOW 2 WTIP (MISCELLANEOUS) ×3
IRRIGATION SUCT STRKRFLW 2 WTP (MISCELLANEOUS) ×1 IMPLANT
IV CATH 14GX2 1/4 (CATHETERS) ×3 IMPLANT
KIT BASIN OR (CUSTOM PROCEDURE TRAY) ×3 IMPLANT
L-HOOK LAP DISP 36CM (ELECTROSURGICAL) ×3
LHOOK LAP DISP 36CM (ELECTROSURGICAL) ×1 IMPLANT
LIQUID BAND (GAUZE/BANDAGES/DRESSINGS) ×3 IMPLANT
POUCH RETRIEVAL ECOSAC 10 (ENDOMECHANICALS) IMPLANT
POUCH RETRIEVAL ECOSAC 10MM (ENDOMECHANICALS)
SCISSORS LAP 5X45 EPIX DISP (ENDOMECHANICALS) ×3 IMPLANT
SLEEVE XCEL OPT CAN 5 100 (ENDOMECHANICALS) ×6 IMPLANT
STRIP CLOSURE SKIN 1/2X4 (GAUZE/BANDAGES/DRESSINGS) IMPLANT
SUT VIC AB 4-0 SH 18 (SUTURE) ×3 IMPLANT
SYR 20CC LL (SYRINGE) ×3 IMPLANT
TOWEL OR 17X26 10 PK STRL BLUE (TOWEL DISPOSABLE) ×3 IMPLANT
TRAY LAPAROSCOPIC (CUSTOM PROCEDURE TRAY) ×3 IMPLANT
TROCAR BLADELESS OPT 5 100 (ENDOMECHANICALS) ×3 IMPLANT
TROCAR XCEL BLUNT TIP 100MML (ENDOMECHANICALS) ×3 IMPLANT
TROCAR XCEL NON-BLD 11X100MML (ENDOMECHANICALS) ×3 IMPLANT
TUBING INSUF HEATED (TUBING) ×3 IMPLANT

## 2015-12-11 NOTE — Anesthesia Preprocedure Evaluation (Addendum)
Anesthesia Evaluation  Patient identified by MRN, date of birth, ID band Patient awake    Reviewed: Allergy & Precautions, H&P , NPO status , Patient's Chart, lab work & pertinent test results  Airway Mallampati: III  TM Distance: >3 FB Neck ROM: Full    Dental no notable dental hx. (+) Poor Dentition, Dental Advisory Given   Pulmonary neg pulmonary ROS,    Pulmonary exam normal breath sounds clear to auscultation       Cardiovascular hypertension, On Medications  Rhythm:Regular Rate:Normal     Neuro/Psych negative neurological ROS  negative psych ROS   GI/Hepatic negative GI ROS, Neg liver ROS,   Endo/Other  Morbid obesity  Renal/GU negative Renal ROS  negative genitourinary   Musculoskeletal   Abdominal   Peds  Hematology negative hematology ROS (+)   Anesthesia Other Findings   Reproductive/Obstetrics negative OB ROS                            Anesthesia Physical Anesthesia Plan  ASA: III  Anesthesia Plan: General   Post-op Pain Management:    Induction: Intravenous  Airway Management Planned: Oral ETT  Additional Equipment:   Intra-op Plan:   Post-operative Plan: Extubation in OR  Informed Consent: I have reviewed the patients History and Physical, chart, labs and discussed the procedure including the risks, benefits and alternatives for the proposed anesthesia with the patient or authorized representative who has indicated his/her understanding and acceptance.   Dental advisory given  Plan Discussed with: CRNA  Anesthesia Plan Comments:         Anesthesia Quick Evaluation

## 2015-12-11 NOTE — Transfer of Care (Signed)
Immediate Anesthesia Transfer of Care Note  Patient: Jessica Robertson  Procedure(s) Performed: Procedure(s): LAPAROSCOPIC CHOLECYSTECTOMY WITH INTRAOPERATIVE CHOLANGIOGRAM (N/A)  Patient Location: PACU  Anesthesia Type:General  Level of Consciousness: awake, alert , oriented and patient cooperative  Airway & Oxygen Therapy: Patient Spontanous Breathing and Patient connected to face mask oxygen  Post-op Assessment: Report given to RN, Post -op Vital signs reviewed and stable and Patient moving all extremities  Post vital signs: Reviewed and stable  Last Vitals:  Vitals:   12/11/15 0810 12/11/15 1151  BP: 128/73 (!) 169/94  Pulse: 76 93  Resp: 17 (!) 9  Temp: 37 C 36.4 C    Last Pain:  Vitals:   12/11/15 0906  TempSrc:   PainSc: 3       Patients Stated Pain Goal: 3 (12/11/15 0906)  Complications: No apparent anesthesia complications

## 2015-12-11 NOTE — Progress Notes (Signed)
Discharge follow up appointment and instruction in AVS, she just needs discharge Meds done.

## 2015-12-11 NOTE — Anesthesia Postprocedure Evaluation (Signed)
Anesthesia Post Note  Patient: Jessica Robertson  Procedure(s) Performed: Procedure(s) (LRB): LAPAROSCOPIC CHOLECYSTECTOMY WITH INTRAOPERATIVE CHOLANGIOGRAM (N/A)  Patient location during evaluation: PACU Anesthesia Type: General Level of consciousness: awake and alert Pain management: pain level controlled Vital Signs Assessment: post-procedure vital signs reviewed and stable Respiratory status: spontaneous breathing, nonlabored ventilation, respiratory function stable and patient connected to nasal cannula oxygen Cardiovascular status: blood pressure returned to baseline and stable Postop Assessment: no signs of nausea or vomiting Anesthetic complications: no    Last Vitals:  Vitals:   12/11/15 1300 12/11/15 1317  BP:  (!) 162/85  Pulse:    Resp:  16  Temp: 36.7 C 37.1 C    Last Pain:  Vitals:   12/11/15 1300  TempSrc:   PainSc: Asleep                 Stedman [place],W. EDMOND

## 2015-12-11 NOTE — Op Note (Signed)
Thornton ParkJohnetta Bergey   12/11/2015  12:09 PM  Procedure: Laparoscopic Cholecystectomy with intraoperative cholangiogram  Surgeon: Susy FrizzleMatt B. Daphine DeutscherMartin, MD, FACS Asst:  none  Anes:  General  Drains:  None  Findings: Chronic cholecystitis with adhesions to duodenum; normal IOC  Description of Procedure: The patient was taken to OR 1 and given general anesthesia.  The patient was prepped with PCMX and draped sterilely. A time out was performed.  Access to the abdomen was achieved with a 5 mm Optiview through the left upper quadrant.  Her liver was quite large and came almost down to her umbilicus.  Port placement included a Hasson passed into the umbilicus using a 11 mm obturator and an 11 mm in toward the midline.  A 5 was placed laterally to elevate the gallbladder.    The gallbladder was visualized and the fundus was grasped and the gallbladder was elevated. Traction on the infundibulum allowed for successful demonstration of the critical view. Inflammatory changes were acute and chronic.  The cystic duct was identified and clipped up on the gallbladder and an incision was made in the cystic duct;  Three cholesterol stones were milked out and were obstructing the cystic duct;   the Reddick catheter was inserted after milking the cystic duct of any debris. A dynamic cholangiogram was performed which demonstrated showed intrahepatic filling and flow into the duodenum.    The cystic duct was then triple clipped and divided, the cystic artery was double clipped and divided and then the gallbladder was removed from the gallbladder bed. Removal of the gallbladder from the gallbladder bed was performed without spillage.  The gallbladder was then placed in a bag and brought out through one of the trocar sites. The gallbladder bed was inspected and no bleeding or bile leaks were seen.    Incisions were injected with Exparel and closed with 4-0 Vicryl and Liquiban on the skin.  Sponge and needle count were correct.     The patient was taken to the recovery room in satisfactory condition.

## 2015-12-11 NOTE — Anesthesia Procedure Notes (Signed)
Procedure Name: Intubation Date/Time: 12/11/2015 10:02 AM Performed by: Jarvis NewcomerARMISTEAD, Race Latour A Pre-anesthesia Checklist: Patient identified, Timeout performed, Emergency Drugs available, Suction available and Patient being monitored Patient Re-evaluated:Patient Re-evaluated prior to inductionOxygen Delivery Method: Circle system utilized Preoxygenation: Pre-oxygenation with 100% oxygen Intubation Type: IV induction Ventilation: Mask ventilation without difficulty Laryngoscope Size: Mac and 4 Grade View: Grade I Tube type: Oral Tube size: 7.5 mm Number of attempts: 1 Airway Equipment and Method: Stylet Placement Confirmation: ETT inserted through vocal cords under direct vision,  positive ETCO2 and breath sounds checked- equal and bilateral Secured at: 22 cm Tube secured with: Tape Dental Injury: Teeth and Oropharynx as per pre-operative assessment

## 2015-12-12 ENCOUNTER — Encounter (HOSPITAL_COMMUNITY): Payer: Self-pay | Admitting: Surgery

## 2015-12-12 DIAGNOSIS — E785 Hyperlipidemia, unspecified: Secondary | ICD-10-CM | POA: Diagnosis present

## 2015-12-12 DIAGNOSIS — I10 Essential (primary) hypertension: Secondary | ICD-10-CM | POA: Diagnosis present

## 2015-12-12 MED ORDER — NAPROXEN 500 MG PO TABS: 500.0000 mg | ORAL_TABLET | Freq: Two times a day (BID) | ORAL | Status: DC

## 2015-12-12 MED ORDER — NAPROXEN 500 MG PO TABS: 500.0000 mg | ORAL_TABLET | Freq: Two times a day (BID) | ORAL | 0 refills | Status: DC

## 2015-12-12 MED ORDER — HYDROCODONE-ACETAMINOPHEN 5-325 MG PO TABS: 1.0000 | ORAL_TABLET | ORAL | 0 refills | Status: DC | PRN

## 2015-12-12 NOTE — Discharge Instructions (Signed)
LAPAROSCOPIC SURGERY: POST OP INSTRUCTIONS ° °###################################################################### ° °EAT °Gradually transition to a high fiber diet with a fiber supplement over the next few weeks after discharge.  Start with a pureed / full liquid diet (see below) ° °WALK °Walk an hour a day.  Control your pain to do that.   ° °CONTROL PAIN °Control pain so that you can walk, sleep, tolerate sneezing/coughing, go up/down stairs. ° °HAVE A BOWEL MOVEMENT DAILY °Keep your bowels regular to avoid problems.  OK to try a laxative to override constipation.  OK to use an antidairrheal to slow down diarrhea.  Call if not better after 2 tries ° °CALL IF YOU HAVE PROBLEMS/CONCERNS °Call if you are still struggling despite following these instructions. °Call if you have concerns not answered by these instructions ° °###################################################################### ° ° ° °1. DIET: Follow a light bland diet the first 24 hours after arrival home, such as soup, liquids, crackers, etc.  Be sure to include lots of fluids daily.  Avoid fast food or heavy meals as your are more likely to get nauseated.  Eat a low fat the next few days after surgery.   °2. Take your usually prescribed home medications unless otherwise directed. °3. PAIN CONTROL: °a. Pain is best controlled by a usual combination of three different methods TOGETHER: °i. Ice/Heat °ii. Over the counter pain medication °iii. Prescription pain medication °b. Most patients will experience some swelling and bruising around the incisions.  Ice packs or heating pads (30-60 minutes up to 6 times a day) will help. Use ice for the first few days to help decrease swelling and bruising, then switch to heat to help relax tight/sore spots and speed recovery.  Some people prefer to use ice alone, heat alone, alternating between ice & heat.  Experiment to what works for you.  Swelling and bruising can take several weeks to resolve.   °c. It is  helpful to take an over-the-counter pain medication regularly for the first few weeks.  Choose one of the following that works best for you: °i. Naproxen (Aleve, etc)  Two 220mg tabs twice a day °ii. Ibuprofen (Advil, etc) Three 200mg tabs four times a day (every meal & bedtime) °iii. Acetaminophen (Tylenol, etc) 500-650mg four times a day (every meal & bedtime) °d. A  prescription for pain medication (such as oxycodone, hydrocodone, etc) should be given to you upon discharge.  Take your pain medication as prescribed.  °i. If you are having problems/concerns with the prescription medicine (does not control pain, nausea, vomiting, rash, itching, etc), please call us [phone] to see if we need to switch you to a different pain medicine that will work better for you and/or control your side effect better. °ii. If you need a refill on your pain medication, please contact your pharmacy.  They will contact our office to request authorization. Prescriptions will not be filled after 5 pm or on week-ends. °4. Avoid getting constipated.  Between the surgery and the pain medications, it is common to experience some constipation.  Increasing fluid intake and taking a fiber supplement (such as Metamucil, Citrucel, FiberCon, MiraLax, etc) 1-2 times a day regularly will usually help prevent this problem from occurring.  A mild laxative (prune juice, Milk of Magnesia, MiraLax, etc) should be taken according to package directions if there are no bowel movements after 48 hours.   °5. Watch out for diarrhea.  If you have many loose bowel movements, simplify your diet to bland foods & liquids for   a few days.  Stop any stool softeners and decrease your fiber supplement.  Switching to mild anti-diarrheal medications (Kayopectate, Pepto Bismol) can help.  If this worsens or does not improve, please call us. °6. Wash / shower every day.  You may shower over the dressings as they are waterproof.  Continue to shower over incision(s)  after the dressing is off. °7. Remove your waterproof bandages 5 days after surgery.  You may leave the incision open to air.  You may replace a dressing/Band-Aid to cover the incision for comfort if you wish.  °8. ACTIVITIES as tolerated:   °a. You may resume regular (light) daily activities beginning the next day--such as daily self-care, walking, climbing stairs--gradually increasing activities as tolerated.  If you can walk 30 minutes without difficulty, it is safe to try more intense activity such as jogging, treadmill, bicycling, low-impact aerobics, swimming, etc. °b. Save the most intensive and strenuous activity for last such as sit-ups, heavy lifting, contact sports, etc  Refrain from any heavy lifting or straining until you are off narcotics for pain control.   °c. DO NOT PUSH THROUGH PAIN.  Let pain be your guide: If it hurts to do something, don't do it.  Pain is your body warning you to avoid that activity for another week until the pain goes down. °d. You may drive when you are no longer taking prescription pain medication, you can comfortably wear a seatbelt, and you can safely maneuver your car and apply brakes. °e. You may have sexual intercourse when it is comfortable.  °9. FOLLOW UP in our office °a. Please call [facility] at [phone] to set up an appointment to see your surgeon in the office for a follow-up appointment approximately 2-3 weeks after your surgery. °b. Make sure that you call for this appointment the day you arrive home to insure a convenient appointment time. °10. IF YOU HAVE DISABILITY OR FAMILY LEAVE FORMS, BRING THEM TO THE OFFICE FOR PROCESSING.  DO NOT GIVE THEM TO YOUR DOCTOR. ° ° °WHEN TO CALL US [phone]: °1. Poor pain control °2. Reactions / problems with new medications (rash/itching, nausea, etc)  °3. Fever over 101.5 F (38.5 C) °4. Inability to urinate °5. Nausea and/or vomiting °6. Worsening swelling or bruising °7. Continued bleeding from incision. °8. Increased  pain, redness, or drainage from the incision ° ° The clinic staff is available to answer your questions during regular business hours (8:30am-5pm).  Please don’t hesitate to call and ask to speak to one of our nurses for clinical concerns.  ° If you have a medical emergency, go to the nearest emergency room or call 911. ° A surgeon from [facility] is always on call at the hospitals ° ° °[facility] °[address], [address], [place], [place]  [address] ? °MAIN: [phone] ? TOLL FREE: [phone] ?  °FAX [phone] °[email or web address] ° ° ° ° °Cholecystitis °Cholecystitis is inflammation of the gallbladder. It is often called a gallbladder attack. The gallbladder is a pear-shaped organ that lies beneath the liver on the right side of the body. The gallbladder stores bile, which is a fluid that helps the body to digest fats. If bile builds up in your gallbladder, your gallbladder becomes inflamed. This condition may occur suddenly (be acute). Repeat episodes of acute cholecystitis or prolonged episodes may lead to a long-term (chronic) condition. Cholecystitis is serious and it requires treatment.  °CAUSES °The most common cause of this condition   is gallstones. Gallstones can block the tube (duct) that carries bile out of your gallbladder. This causes bile to build up. Other causes of this condition include: °· Damage to the gallbladder due to a decrease in blood flow. °· Infections in the bile ducts. °· Scars or kinks in the bile ducts. °· Tumors in the liver, pancreas, or gallbladder. °RISK FACTORS °This condition is more likely to develop in: °· People who have sickle cell disease. °· People who take birth control pills or use estrogen. °· People who have alcoholic liver disease. °· People who have liver cirrhosis. °· People who have their nutrition delivered through a vein (parenteral nutrition). °· People who do not eat or drink (do fasting) for a long  period of time. °· People who are obese. °· People who have rapid weight loss. °· People who are pregnant. °· People who have increased triglyceride levels. °· People who have pancreatitis. °SYMPTOMS °Symptoms of this condition include: °· Abdominal pain, especially in the upper right area of the abdomen. °· Abdominal tenderness or bloating. °· Nausea. °· Vomiting. °· Fever. °· Chills. °· Yellowing of the skin and the whites of the eyes (jaundice). °DIAGNOSIS °This condition is diagnosed with a medical history and physical exam. You may also have other tests, including: °· Imaging tests, such as: °¨ An ultrasound of the gallbladder. °¨ A CT scan of the abdomen. °¨ A gallbladder nuclear scan (HIDA scan). This scan allows your health care provider to see the bile moving from your liver to your gallbladder and to your small intestine. °¨ MRI. °· Blood tests, such as: °¨ A complete blood count, because the white blood cell count may be higher than normal. °¨ Liver function tests, because some levels may be higher than normal with certain types of gallstones. °TREATMENT °Treatment may include: °· Fasting for a certain amount of time. °· IV fluids. °· Medicine to treat pain or vomiting. °· Antibiotic medicine. °· Surgery to remove your gallbladder (cholecystectomy). This may happen immediately or at a later time. °HOME CARE INSTRUCTIONS °Home care will depend on your treatment. In general: °· Take over-the-counter and prescription medicines only as told by your health care provider. °· If you were prescribed an antibiotic medicine, take it as told by your health care provider. Do not stop taking the antibiotic even if you start to feel better. °· Follow instructions from your health care provider about what to eat or drink. When you are allowed to eat, avoid eating or drinking anything that triggers your symptoms. °· Keep all follow-up visits as told by your health care provider. This is important. °SEEK MEDICAL CARE  IF: °· Your pain is not controlled with medicine. °· You have a fever. °SEEK IMMEDIATE MEDICAL CARE IF: °· Your pain moves to another part of your abdomen or to your back. °· You continue to have symptoms or you develop new symptoms even with treatment. °  °This information is not intended to replace advice given to you by your health care provider. Make sure you discuss any questions you have with your health care provider. °  °Document Released: 04/11/2005 Document Revised: 12/31/2014 Document Reviewed: 07/23/2014 °[employer] Interactive Patient Education ©2016 [employer] ° °

## 2015-12-12 NOTE — Progress Notes (Signed)
Nurse reviewed discharge instructions with pt.  Pt verbalized understanding of discharge instructions, new medications and follow up appointment.  No concerns at time of discharge. 

## 2015-12-12 NOTE — Discharge Summary (Signed)
Physician Discharge Summary  Patient ID: Jessica Robertson MRN: [id number] DOB/AGE: [birth year] 50 y.o.  Admit date: 12/10/2015 Discharge date: 12/12/2015  Patient Care Team: Aleen Campi, NP as PCP - General  Admission Diagnoses: Principal Problem:   Acute Cholecystitis s/p lap cholecystectomy 12/10/2015 Active Problems:   Hypertension   Hyperlipemia   Discharge Diagnoses:  Principal Problem:   Acute Cholecystitis s/p lap cholecystectomy 12/10/2015 Active Problems:   Hypertension   Hyperlipemia   POST-OPERATIVE DIAGNOSIS:   cholelithhiasis, cholecystitis  SURGERY:  12/10/2015 - 12/11/2015  Procedure(s): LAPAROSCOPIC CHOLECYSTECTOMY WITH INTRAOPERATIVE CHOLANGIOGRAM  SURGEON:    Surgeon(s): Johnathan Hausen, MD  Consults: None  Hospital Course:   The patient underwent the surgery above.  Postoperatively, the patient gradually mobilized and advanced to a solid diet.  Pain and other symptoms were treated aggressively.    By the time of discharge, the patient was walking well the hallways, eating food, having flatus.  Pain was well-controlled on an oral medications.  Based on meeting discharge criteria and continuing to recover, I felt it was safe for the patient to be discharged from the hospital to further recover with close followup. Postoperative recommendations were discussed in detail.  They are written as well.   Significant Diagnostic Studies:  Results for orders placed or performed during the hospital encounter of 12/10/15 (from the past 72 hour(s))  Troponin I     Status: Abnormal   Collection Time: 12/10/15  6:55 AM  Result Value Ref Range   Troponin I 0.03 (HH) <0.03 ng/mL    Comment: CRITICAL RESULT CALLED TO, READ BACK BY AND VERIFIED WITH: CALLED TO B.BROOKS RN AT 0732 BY S.ROY ON [id number]   CBC with Differential     Status: None   Collection Time: 12/10/15  6:55 AM  Result Value Ref Range   WBC 5.7 4.0 - 10.5 K/uL   RBC 4.55 3.87 - 5.11 MIL/uL    Hemoglobin 12.8 12.0 - 15.0 g/dL   HCT 38.6 36.0 - 46.0 %   MCV 84.8 78.0 - 100.0 fL   MCH 28.1 26.0 - 34.0 pg   MCHC 33.2 30.0 - 36.0 g/dL   RDW 14.0 11.5 - 15.5 %   Platelets 237 150 - 400 K/uL   Neutrophils Relative % 59 %   Neutro Abs 3.4 1.7 - 7.7 K/uL   Lymphocytes Relative 27 %   Lymphs Abs 1.5 0.7 - 4.0 K/uL   Monocytes Relative 11 %   Monocytes Absolute 0.6 0.1 - 1.0 K/uL   Eosinophils Relative 2 %   Eosinophils Absolute 0.1 0.0 - 0.7 K/uL   Basophils Relative 0 %   Basophils Absolute 0.0 0.0 - 0.1 K/uL  Comprehensive metabolic panel     Status: Abnormal   Collection Time: 12/10/15  7:38 AM  Result Value Ref Range   Sodium 138 135 - 145 mmol/L   Potassium 3.5 3.5 - 5.1 mmol/L   Chloride 105 101 - 111 mmol/L   CO2 29 22 - 32 mmol/L   Glucose, Bld 149 (H) 65 - 99 mg/dL   BUN 12 6 - 20 mg/dL   Creatinine, Ser 0.81 0.44 - 1.00 mg/dL   Calcium 9.6 8.9 - 10.3 mg/dL   Total Protein 7.2 6.5 - 8.1 g/dL   Albumin 3.5 3.5 - 5.0 g/dL   AST 63 (H) 15 - 41 U/L   ALT 220 (H) 14 - 54 U/L   Alkaline Phosphatase 133 (H) 38 - 126 U/L   Total Bilirubin 1.0  0.3 - 1.2 mg/dL   GFR calc non Af Amer >60 >60 mL/min   GFR calc Af Amer >60 >60 mL/min    Comment: (NOTE) The eGFR has been calculated using the CKD EPI equation. This calculation has not been validated in all clinical situations. eGFR's persistently <60 mL/min signify possible Chronic Kidney Disease.    Anion gap 4 (L) 5 - 15  Lipase, blood     Status: None   Collection Time: 12/10/15  7:38 AM  Result Value Ref Range   Lipase 43 11 - 51 U/L  Urinalysis, Routine w reflex microscopic     Status: Abnormal   Collection Time: 12/10/15  9:48 AM  Result Value Ref Range   Color, Urine AMBER (A) YELLOW    Comment: BIOCHEMICALS MAY BE AFFECTED BY COLOR   APPearance CLOUDY (A) CLEAR   Specific Gravity, Urine 1.023 1.005 - 1.030   pH 5.5 5.0 - 8.0   Glucose, UA NEGATIVE NEGATIVE mg/dL   Hgb urine dipstick TRACE (A) NEGATIVE    Bilirubin Urine NEGATIVE NEGATIVE   Ketones, ur NEGATIVE NEGATIVE mg/dL   Protein, ur 100 (A) NEGATIVE mg/dL   Nitrite NEGATIVE NEGATIVE   Leukocytes, UA NEGATIVE NEGATIVE  Urine microscopic-add on     Status: Abnormal   Collection Time: 12/10/15  9:48 AM  Result Value Ref Range   Squamous Epithelial / LPF 0-5 (A) NONE SEEN   WBC, UA 0-5 0 - 5 WBC/hpf   RBC / HPF 0-5 0 - 5 RBC/hpf   Bacteria, UA FEW (A) NONE SEEN  Surgical pcr screen     Status: None   Collection Time: 12/10/15  6:00 PM  Result Value Ref Range   MRSA, PCR NEGATIVE NEGATIVE   Staphylococcus aureus NEGATIVE NEGATIVE    Comment:        The Xpert SA Assay (FDA approved for NASAL specimens in patients over 59 years of age), is one component of a comprehensive surveillance program.  Test performance has been validated by [facility] for patients greater than or equal to 94 year old. It is not intended to diagnose infection nor to guide or monitor treatment.   Comprehensive metabolic panel     Status: Abnormal   Collection Time: 12/11/15  5:06 AM  Result Value Ref Range   Sodium 138 135 - 145 mmol/L   Potassium 6.1 (H) 3.5 - 5.1 mmol/L   Chloride 105 101 - 111 mmol/L   CO2 28 22 - 32 mmol/L   Glucose, Bld 148 (H) 65 - 99 mg/dL   BUN 10 6 - 20 mg/dL   Creatinine, Ser 0.88 0.44 - 1.00 mg/dL   Calcium 9.5 8.9 - 10.3 mg/dL   Total Protein 7.1 6.5 - 8.1 g/dL   Albumin 3.5 3.5 - 5.0 g/dL   AST 46 (H) 15 - 41 U/L   ALT 146 (H) 14 - 54 U/L   Alkaline Phosphatase 122 38 - 126 U/L   Total Bilirubin 0.9 0.3 - 1.2 mg/dL   GFR calc non Af Amer >60 >60 mL/min   GFR calc Af Amer >60 >60 mL/min    Comment: (NOTE) The eGFR has been calculated using the CKD EPI equation. This calculation has not been validated in all clinical situations. eGFR's persistently <60 mL/min signify possible Chronic Kidney Disease.    Anion gap 5 5 - 15  CBC     Status: None   Collection Time: 12/11/15  5:06 AM  Result Value Ref Range  WBC 5.2 4.0 - 10.5 K/uL   RBC 4.42 3.87 - 5.11 MIL/uL   Hemoglobin 12.1 12.0 - 15.0 g/dL   HCT 38.4 36.0 - 46.0 %   MCV 86.9 78.0 - 100.0 fL   MCH 27.4 26.0 - 34.0 pg   MCHC 31.5 30.0 - 36.0 g/dL   RDW 14.3 11.5 - 15.5 %   Platelets 215 150 - 400 K/uL  Protime-INR     Status: None   Collection Time: 12/11/15  5:06 AM  Result Value Ref Range   Prothrombin Time 12.0 11.4 - 15.2 seconds   INR 2.37   Basic metabolic panel     Status: Abnormal   Collection Time: 12/11/15  7:42 AM  Result Value Ref Range   Sodium 138 135 - 145 mmol/L   Potassium 4.1 3.5 - 5.1 mmol/L    Comment: DELTA CHECK NOTED REPEATED TO VERIFY    Chloride 105 101 - 111 mmol/L   CO2 27 22 - 32 mmol/L   Glucose, Bld 138 (H) 65 - 99 mg/dL   BUN 10 6 - 20 mg/dL   Creatinine, Ser 0.82 0.44 - 1.00 mg/dL   Calcium 9.4 8.9 - 10.3 mg/dL   GFR calc non Af Amer >60 >60 mL/min   GFR calc Af Amer >60 >60 mL/min    Comment: (NOTE) The eGFR has been calculated using the CKD EPI equation. This calculation has not been validated in all clinical situations. eGFR's persistently <60 mL/min signify possible Chronic Kidney Disease.    Anion gap 6 5 - 15    Dg Cholangiogram Operative  Result Date: 12/11/2015 CLINICAL DATA:  50 year old female with acute cholecystitis EXAM: INTRAOPERATIVE CHOLANGIOGRAM TECHNIQUE: Cholangiographic images from the C-arm fluoroscopic device were submitted for interpretation post-operatively. Please see the procedural report for the amount of contrast and the fluoroscopy time utilized. COMPARISON:  Abdominal ultrasound 12/10/2015 FINDINGS: A cine clip obtained at the time of intraoperative cholangiogram during laparoscopic cholecystectomy is submitted for review. The images demonstrate cannulation of the cystic duct remanent and opacification of the biliary tree. No evidence of biliary ductal dilatation, stenosis, stricture or choledocholithiasis. Contrast material passes through the ampulla and into the  duodenum. IMPRESSION: Negative intraoperative cholangiogram. Electronically Signed   By: Jacqulynn Cadet M.D.   On: 12/11/2015 11:34   US Abdomen Complete  Result Date: 12/10/2015 CLINICAL DATA:  Epigastric pain and nausea for the past 4 days. EXAM: ABDOMEN ULTRASOUND COMPLETE COMPARISON:  None in PACs FINDINGS: Gallbladder: The gallbladder is only partially distended. There are multiple echogenic mobile shadowing stones. There is mild gallbladder wall thickening to 4 mm. There is a positive sonographic Murphy's sign. Common bile duct: Diameter: 2 mm Liver: The hepatic echotexture is mildly increased diffusely. There is no focal mass nor ductal dilation. IVC: Bowel gas limits evaluation of the inferior vena cava. Pancreas: Bowel gas limits evaluation of the pancreatic head and tail. Spleen: Size and appearance within normal limits. Right Kidney: Length: 12 cm. Echogenicity within normal limits. No mass or hydronephrosis visualized. Left Kidney: Length: 12.2 cm. Echogenicity within normal limits. No mass or hydronephrosis visualized. Abdominal aorta: No aneurysm visualized. Other findings: There is no ascites. IMPRESSION: 1. Gallstones with findings suspicious for acute cholecystitis. 2. Fatty infiltrative change of the liver. Electronically Signed   By: David  Martinique M.D.   On: 12/10/2015 08:29    Discharge Exam: Blood pressure (!) 157/74, pulse 77, temperature 98 F (36.7 C), temperature source Oral, resp. rate 18, height '5\' 6"'$  (1.676  m), weight 99.8 kg (220 lb), SpO2 96 %.  General: Pt awake/alert/oriented x4 in no major acute distress Eyes: PERRL, normal EOM. Sclera nonicteric Neuro: CN II-XII intact w/o focal sensory/motor deficits. Lymph: No head/neck/groin lymphadenopathy Psych:  No delerium/psychosis/paranoia HENT: Normocephalic, Mucus membranes moist.  No thrush Neck: Supple, No tracheal deviation Chest: No pain.  Good respiratory excursion. CV:  Pulses intact.  Regular rhythm MS:  Normal AROM mjr joints.  No obvious deformity Abdomen: Soft, Nondistended.  Mild TTP at lap port site incisions.  No incarcerated hernias. Ext:  SCDs BLE.  No significant edema.  No cyanosis Skin: No petechiae / purpura  Discharged Condition: good   Past Medical History:  Diagnosis Date  . Gout   . Hyperlipemia   . Hypertension   . Plantar fasciitis     Past Surgical History:  Procedure Laterality Date  . ablation    . CHOLECYSTECTOMY N/A 12/11/2015   Procedure: LAPAROSCOPIC CHOLECYSTECTOMY WITH INTRAOPERATIVE CHOLANGIOGRAM;  Surgeon: Johnathan Hausen, MD;  Location: [facility];  Service: General;  Laterality: N/A;  . LYMPH NODE DISSECTION    . TUBAL LIGATION      Social History   Social History  . Marital status: Married    Spouse name: N/A  . Number of children: N/A  . Years of education: N/A   Occupational History  . Not on file.   Social History Main Topics  . Smoking status: Never Smoker  . Smokeless tobacco: Never Used  . Alcohol use No  . Drug use: No  . Sexual activity: Not on file   Other Topics Concern  . Not on file   Social History Narrative  . No narrative on file    History reviewed. No pertinent family history.  Current Facility-Administered Medications  Medication Dose Route Frequency Provider Last Rate Last Dose  . amLODipine (NORVASC) tablet 5 mg  5 mg Oral Daily Johnathan Hausen, MD   5 mg at 12/12/15 1610  . heparin injection 5,000 Units  5,000 Units Subcutaneous Q8H Johnathan Hausen, MD      . HYDROcodone-acetaminophen (NORCO/VICODIN) 5-325 MG per tablet 1-2 tablet  1-2 tablet Oral Q4H PRN Johnathan Hausen, MD      . Morphine Sulfate (PF) SOLN 1 mg  1 mg Intravenous Q1H PRN Johnathan Hausen, MD   1 mg at 12/12/15 0049  . naproxen (NAPROSYN) tablet 500 mg  500 mg Oral BID WC Michael Boston, MD      . ondansetron (ZOFRAN-ODT) disintegrating tablet 4 mg  4 mg Oral Q6H PRN Johnathan Hausen, MD       Or  . ondansetron [facility]) injection 4 mg  4 mg Intravenous  Q6H PRN Johnathan Hausen, MD         No Known Allergies  Disposition: 01-Home or Self Care  Discharge Instructions    Call MD for:    Complete by:  As directed   Temperature > 101.37F   Call MD for:  extreme fatigue    Complete by:  As directed   Call MD for:  hives    Complete by:  As directed   Call MD for:  persistant nausea and vomiting    Complete by:  As directed   Call MD for:  redness, tenderness, or signs of infection (pain, swelling, redness, odor or green/yellow discharge around incision site)    Complete by:  As directed   Call MD for:  severe uncontrolled pain    Complete by:  As directed   Diet -  low sodium heart healthy    Complete by:  As directed   Start with bland, low residue diet for a few days, then advance to a heart healthy (low fat, high fiber) diet.  If you feel nauseated or constipated, simplify to a liquid only diet for 48 hours until you are feeling better (no more nausea, farting/passing gas, having a bowel movement, etc...).  If you cannot tolerate even drinking liquids, or feeling worse, let your surgeon know or go to the Emergency Department for help.   Discharge instructions    Complete by:  As directed   Please see discharge instruction sheets.   Also refer to any handouts/printouts that may have been given from the [facility] surgery office (if you visited [place] there before surgery) Please call our office if you have any questions or concerns [phone]   Discharge wound care:    Complete by:  As directed   If you have closed incisions: Shower and bathe over these incisions with soap and water every day.  It is OK to wash over the dressings: they are waterproof. Remove all surgical dressings on postoperative day #3.  You do not need to replace dressings over the closed incisions unless you feel more comfortable with a Band-Aid covering it.   If you have an open wound: That requires packing, so please see wound care instructions.   In general, remove all  dressings, wash wound with soap and water and then replace with saline moistened gauze.  Do the dressing change at least every day.    Please call our office [phone] if you have further questions.   Driving Restrictions    Complete by:  As directed   No driving until off narcotics and can safely swerve away without pain during an emergency   Increase activity slowly    Complete by:  As directed   Lifting restrictions    Complete by:  As directed   Avoid heavy lifting initially, <20 pounds at first.   Do not push through pain.   You have no specific weight limit: If it hurts to do, DON'T DO IT.    If you feel no pain, you are not injuring anything.  Pain will protect you from injury.   Coughing and sneezing are far more stressful to your incision than any lifting.   Avoid resuming heavy lifting (>50 pounds) or other intense activity until off all narcotic pain medications.   When want to exercise more, give yourself 2 weeks to gradually get back to full intense exercise/activity.   May shower / Bathe    Complete by:  As directed   [place].  It is fine for dressings or wounds to be washed/rinsed.  Use gentle soap & water.  This will help the incisions and/or wounds get clean & minimize infection.   May walk up steps    Complete by:  As directed   Sexual Activity Restrictions    Complete by:  As directed   Sexual activity as tolerated.  Do not push through pain.  Pain will protect you from injury.   Walk with assistance    Complete by:  As directed   Walk over an hour a day.  May use a walker/cane/companion to help with balance and stamina.       Medication List    STOP taking these medications   predniSONE 20 MG tablet Commonly known as:  DELTASONE     TAKE these medications  amLODipine 5 MG tablet Commonly known as:  NORVASC Take 5 mg by mouth daily.   HYDROcodone-acetaminophen 5-325 MG tablet Commonly known as:  NORCO/VICODIN Take 1-2 tablets by mouth every 4  (four) hours as needed for moderate pain or severe pain.   naproxen 500 MG tablet Commonly known as:  NAPROSYN Take 1 tablet (500 mg total) by mouth 2 (two) times daily with a meal.      Follow-up Information    [facility] Follow up on 12/30/2015.   Specialty:  General Surgery Why:  Your appointment is at 9:15 AM, be there 30 minutes early for check in.   Contact information: [address] [place] [address] [phone]            Signed: Morton Peters, M.D., F.A.C.S. Gastrointestinal and Minimally Invasive Surgery [facility] [address], [place], [place] [address] [phone] Main / Paging   12/12/2015, 10:41 AM

## 2016-05-02 ENCOUNTER — Emergency Department (HOSPITAL_BASED_OUTPATIENT_CLINIC_OR_DEPARTMENT_OTHER)
Admission: EM | Admit: 2016-05-02 | Discharge: 2016-05-02 | Disposition: A | Discharge status: Home / Self Care | Payer: 59 | Attending: Emergency Medicine | Admitting: Emergency Medicine

## 2016-05-02 ENCOUNTER — Encounter (HOSPITAL_BASED_OUTPATIENT_CLINIC_OR_DEPARTMENT_OTHER): Payer: Self-pay | Admitting: Emergency Medicine

## 2016-05-02 DIAGNOSIS — I10 Essential (primary) hypertension: Secondary | ICD-10-CM | POA: Insufficient documentation

## 2016-05-02 DIAGNOSIS — Z79899 Other long term (current) drug therapy: Secondary | ICD-10-CM | POA: Insufficient documentation

## 2016-05-02 DIAGNOSIS — K0889 Other specified disorders of teeth and supporting structures: Secondary | ICD-10-CM | POA: Insufficient documentation

## 2016-05-02 DIAGNOSIS — H9201 Otalgia, right ear: Secondary | ICD-10-CM | POA: Insufficient documentation

## 2016-05-02 MED ORDER — IBUPROFEN 400 MG PO TABS
400.0000 mg | ORAL_TABLET | Freq: Once | ORAL | Status: AC
  Administered 2016-05-02: 400 mg via ORAL
  Filled 2016-05-02: qty 1

## 2016-05-02 MED ORDER — PENICILLIN V POTASSIUM 250 MG PO TABS
500.0000 mg | ORAL_TABLET | Freq: Once | ORAL | Status: AC
  Administered 2016-05-02: 500 mg via ORAL

## 2016-05-02 MED ORDER — PENICILLIN V POTASSIUM 500 MG PO TABS: 500.0000 mg | ORAL_TABLET | Freq: Three times a day (TID) | ORAL | 0 refills | Status: AC

## 2016-05-02 MED ORDER — PENICILLIN V POTASSIUM 250 MG PO TABS
500.0000 mg | ORAL_TABLET | Freq: Three times a day (TID) | ORAL | Status: DC
  Filled 2016-05-02: qty 2

## 2016-05-02 NOTE — ED Provider Notes (Signed)
Complains of pain at tooth #27 for the past several months. No fever. No dysphagia. No other associated symptoms pain radiates to the angle of the mandible. On exam patient has generally poor dentition. Tooth #27 is tender. No fluctuance of the gingiva. No trismus. No cervical lymphadenopathy. Patient is handling secretions well   Doug SouSam Adelin Ventrella, MD 05/02/16 2249

## 2016-05-02 NOTE — ED Provider Notes (Signed)
[facility] Provider Note   CSN: [id number] Arrival date & time: 05/02/16  1827  By signing my name below, I, Linna DarnerRussell Turner, attest that this documentation has been prepared under the direction and in the presence of Tobey Schmelzle, [place]. Electronically Signed: Linna Darnerussell Turner, Scribe. 05/02/2016. 10:23 PM.  History   Chief Complaint Chief Complaint  Patient presents with  . Dental Pain    The history is provided by the patient. No language interpreter was used.     HPI Comments: Jessica Robertson is a 51 y.o. female who presents to the Emergency Department complaining of intermittent, worsening, severe, sharp, right lower dental pain over the last few months, constant over the last few weeks. Pt reports she chipped one of her right lower teeth a few months ago after eating a piece of hard candy and her pain has been present since then. She rates her dental pain at an 8/10 currently but notes "it is coming back" and rates it at 10/10 at worst. She states her pain radiates up the right side of her face and into her right ear. She notes her pain is worse with exposure to hot/cold fluids. Pt reports she could not sleep last night secondary to her dental pain. She has tried Tylenol with no improvement of her pain. Pt was seen by a dentist for the same about a month ago and was started on a course of antibiotics; she reports the abx provided mild, temporary improvement of her dental pain. Pt notes she was not prescribed pain medication by the dentist. She denies fever, chills, CP, SOB, nausea, vomiting, abdominal pain, or any other associated symptoms.  Past Medical History:  Diagnosis Date  . Gout   . Hyperlipemia   . Hypertension   . Plantar fasciitis     Patient Active Problem List   Diagnosis Date Noted  . Hypertension   . Hyperlipemia   . Acute Cholecystitis s/p lap cholecystectomy 12/10/2015 12/10/2015    Past Surgical History:  Procedure Laterality Date  . ablation      . CHOLECYSTECTOMY N/A 12/11/2015   Procedure: LAPAROSCOPIC CHOLECYSTECTOMY WITH INTRAOPERATIVE CHOLANGIOGRAM;  Surgeon: Luretha MurphyMatthew Martin, MD;  Location: [facility];  Service: General;  Laterality: N/A;  . LYMPH NODE DISSECTION    . TUBAL LIGATION      OB History    No data available       Home Medications    Prior to Admission medications   Medication Sig Start Date End Date Taking? Authorizing Provider  amoxicillin (AMOXIL) 500 MG capsule Take 500 mg by mouth 3 (three) times daily.   Yes Historical Provider, MD  atorvastatin (LIPITOR) 20 MG tablet Take 20 mg by mouth daily.   Yes Historical Provider, MD  lisinopril (PRINIVIL,ZESTRIL) 40 MG tablet Take 40 mg by mouth daily.   Yes Historical Provider, MD  metoprolol succinate (TOPROL-XL) 50 MG 24 hr tablet Take 50 mg by mouth daily. Take with or immediately following a meal.   Yes Historical Provider, MD  amLODipine (NORVASC) 5 MG tablet Take 5 mg by mouth daily. 12/01/15   Historical Provider, MD  HYDROcodone-acetaminophen (NORCO/VICODIN) 5-325 MG tablet Take 1-2 tablets by mouth every 4 (four) hours as needed for moderate pain or severe pain. 12/12/15   Karie SodaSteven Gross, MD  naproxen (NAPROSYN) 500 MG tablet Take 1 tablet (500 mg total) by mouth 2 (two) times daily with a meal. 12/12/15   Karie SodaSteven Gross, MD  penicillin v potassium (VEETID) 500 MG tablet Take 1  tablet (500 mg total) by mouth 3 (three) times daily. 05/02/16 05/09/16  Buddy Loeffelholz Orson Aloe, [place]    Family History History reviewed. No pertinent family history.  Social History Social History  Substance Use Topics  . Smoking status: Never Smoker  . Smokeless tobacco: Never Used  . Alcohol use No     Allergies   Patient has no known allergies.   Review of Systems Review of Systems  Constitutional: Negative for chills and fever.  HENT: Positive for dental problem (right lower) and ear pain (right). Negative for ear discharge and trouble swallowing.   Respiratory: Negative for  shortness of breath.   Cardiovascular: Negative for chest pain.  Gastrointestinal: Negative for abdominal pain, nausea and vomiting.  Skin: Negative for rash and wound.  Neurological: Negative for facial asymmetry, speech difficulty and numbness.     Physical Exam Updated Vital Signs BP 140/77 (BP Location: Left Arm)   Pulse 84   Temp 98.4 F (36.9 C) (Oral)   Resp 16   SpO2 96%   Physical Exam  Constitutional: She is oriented to person, place, and time. She appears well-developed and well-nourished. No distress.  uncomfortable  HENT:  Head: Normocephalic and atraumatic.  Right Ear: Hearing, tympanic membrane and external ear normal.  Left Ear: Hearing, tympanic membrane and external ear normal.  Nose: Nose normal.  Mouth/Throat: Uvula is midline, oropharynx is clear and moist and mucous membranes are normal. No trismus in the jaw. Abnormal dentition. No dental abscesses or lacerations. No posterior oropharyngeal edema, posterior oropharyngeal erythema or tonsillar abscesses. No tonsillar exudate.  Tooth #27: No redness. No TTP. Doesn't appear infectious.  Eyes: Conjunctivae and EOM are normal.  Neck: Normal range of motion. Neck supple. No tracheal deviation present.  Cardiovascular: Normal rate.   Pulmonary/Chest: Effort normal. No respiratory distress.  Musculoskeletal: Normal range of motion.  Lymphadenopathy:       Head (right side): No submental, no submandibular, no tonsillar, no preauricular, no posterior auricular and no occipital adenopathy present.       Head (left side): No submental, no submandibular, no tonsillar, no preauricular, no posterior auricular and no occipital adenopathy present.    She has no cervical adenopathy.  Neurological: She is alert and oriented to person, place, and time.  Skin: Skin is warm and dry. No rash noted. No erythema.  Psychiatric: She has a normal mood and affect. Her behavior is normal.  Nursing note and vitals reviewed.    ED  Treatments / Results  Labs (all labs ordered are listed, but only abnormal results are displayed) Labs Reviewed - No data to display  EKG  EKG Interpretation None       Radiology No results found.  Procedures Procedures (including critical care time)  DIAGNOSTIC STUDIES: Oxygen Saturation is 95% on RA, adequate by my interpretation.    COORDINATION OF CARE: 10:37 PM Discussed treatment plan with pt at bedside and pt agreed to plan.  Medications Ordered in ED Medications  ibuprofen (ADVIL,MOTRIN) tablet 400 mg (400 mg Oral Given 05/02/16 1956)  ibuprofen (ADVIL,MOTRIN) tablet 400 mg (400 mg Oral Given 05/02/16 2255)  penicillin v potassium (VEETID) tablet 500 mg (500 mg Oral Given 05/02/16 2254)     Initial Impression / Assessment and Plan / ED Course  I have reviewed the triage vital signs and the nursing notes.  Pertinent labs & imaging results that were available during my care of the patient were reviewed by me and considered in my medical decision making (  see chart for details).  Clinical Course   Patient with dentalgia to tooth #27 extending down to lower right mandible.  No fluctuance or abscess requiring immediate incision and drainage.  Exam not concerning for Ludwig's angina or pharyngeal abscess at this time. No dysphagia, no cervical lymphadenopathy, no fever or chills, no trismus. Pt has generally poor dentition. Patient able to speak and handle secretions with no difficulty at this time. Will treat with Amoxicillin and OTC pain medication. Pt instructed to follow-up with dentist and also given dental resource guide. Discussed return precautions. Pt safe for discharge.  Final Clinical Impressions(s) / ED Diagnoses  . Final diagnoses:  Pain, dental    New Prescriptions Discharge Medication List as of 05/02/2016 10:58 PM    START taking these medications   Details  penicillin v potassium (VEETID) 500 MG tablet Take 1 tablet (500 mg total) by mouth 3 (three) times  daily., Starting Mon 05/02/2016, Until Mon 05/09/2016, Print       I personally performed the services described in this documentation, which was scribed in my presence. The recorded information has been reviewed and is accurate.   [address] [place], [place] 05/03/16 1331    Doug Sou, MD 05/05/16 [phone]

## 2016-05-02 NOTE — ED Triage Notes (Signed)
Patient reports that she is having right sided ear and dental pain.

## 2016-05-02 NOTE — ED Notes (Signed)
ED Provider at bedside. 

## 2016-05-02 NOTE — ED Notes (Signed)
Ginger Ale and Graham crackers given per pt request.

## 2016-05-02 NOTE — Discharge Instructions (Signed)
Please continue alternating ibuprofen and tylenol as needed for pain. Please take penicillin 3 times a day for 7 days. Please follow-up with your dentist. Please see attached resource dental guide for more information. Please also use warm saltwater rinses to help with pain.  Contact a health care provider if: You continue to have tooth pain after a tooth injury. Your tooth is sensitive to heat and cold. You develop swelling near your injured tooth. You have a fever. You are unable to open your jaw. You are drooling and it is getting worse.

## 2016-06-09 ENCOUNTER — Encounter (HOSPITAL_BASED_OUTPATIENT_CLINIC_OR_DEPARTMENT_OTHER): Payer: Self-pay

## 2016-06-09 ENCOUNTER — Emergency Department (HOSPITAL_BASED_OUTPATIENT_CLINIC_OR_DEPARTMENT_OTHER): Payer: Self-pay

## 2016-06-09 ENCOUNTER — Inpatient Hospital Stay (HOSPITAL_BASED_OUTPATIENT_CLINIC_OR_DEPARTMENT_OTHER)
Admission: EM | Admit: 2016-06-09 | Discharge: 2016-06-13 | DRG: 440 | Disposition: A | Discharge status: Home / Self Care | Payer: 59 | Attending: Internal Medicine | Admitting: Internal Medicine

## 2016-06-09 DIAGNOSIS — K859 Acute pancreatitis without necrosis or infection, unspecified: Secondary | ICD-10-CM | POA: Diagnosis present

## 2016-06-09 DIAGNOSIS — E1165 Type 2 diabetes mellitus with hyperglycemia: Secondary | ICD-10-CM

## 2016-06-09 DIAGNOSIS — Z7984 Long term (current) use of oral hypoglycemic drugs: Secondary | ICD-10-CM

## 2016-06-09 DIAGNOSIS — Z9049 Acquired absence of other specified parts of digestive tract: Secondary | ICD-10-CM

## 2016-06-09 DIAGNOSIS — E119 Type 2 diabetes mellitus without complications: Secondary | ICD-10-CM | POA: Diagnosis present

## 2016-06-09 DIAGNOSIS — Z6838 Body mass index (BMI) 38.0-38.9, adult: Secondary | ICD-10-CM

## 2016-06-09 DIAGNOSIS — Z79899 Other long term (current) drug therapy: Secondary | ICD-10-CM

## 2016-06-09 DIAGNOSIS — Z8249 Family history of ischemic heart disease and other diseases of the circulatory system: Secondary | ICD-10-CM

## 2016-06-09 DIAGNOSIS — K8500 Idiopathic acute pancreatitis without necrosis or infection: Principal | ICD-10-CM | POA: Diagnosis present

## 2016-06-09 DIAGNOSIS — I10 Essential (primary) hypertension: Secondary | ICD-10-CM | POA: Diagnosis present

## 2016-06-09 DIAGNOSIS — E785 Hyperlipidemia, unspecified: Secondary | ICD-10-CM | POA: Diagnosis present

## 2016-06-09 DIAGNOSIS — E669 Obesity, unspecified: Secondary | ICD-10-CM | POA: Diagnosis present

## 2016-06-09 HISTORY — DX: Type 2 diabetes mellitus without complications: E11.9

## 2016-06-09 LAB — URINALYSIS, ROUTINE W REFLEX MICROSCOPIC
BILIRUBIN URINE: NEGATIVE
Glucose, UA: NEGATIVE mg/dL
Hgb urine dipstick: NEGATIVE
Ketones, ur: NEGATIVE mg/dL
LEUKOCYTES UA: NEGATIVE
NITRITE: NEGATIVE
PH: 5.5 (ref 5.0–8.0)
Protein, ur: 100 mg/dL — AB
Specific Gravity, Urine: 1.022 (ref 1.005–1.030)

## 2016-06-09 LAB — CBC WITH DIFFERENTIAL/PLATELET
BASOS ABS: 0 10*3/uL (ref 0.0–0.1)
BASOS PCT: 0 %
EOS ABS: 0.1 10*3/uL (ref 0.0–0.7)
Eosinophils Relative: 1 %
HEMATOCRIT: 37.4 % (ref 36.0–46.0)
HEMOGLOBIN: 12.2 g/dL (ref 12.0–15.0)
Lymphocytes Relative: 28 %
Lymphs Abs: 1.6 10*3/uL (ref 0.7–4.0)
MCH: 28.2 pg (ref 26.0–34.0)
MCHC: 32.6 g/dL (ref 30.0–36.0)
MCV: 86.6 fL (ref 78.0–100.0)
Monocytes Absolute: 0.4 10*3/uL (ref 0.1–1.0)
Monocytes Relative: 8 %
NEUTROS ABS: 3.6 10*3/uL (ref 1.7–7.7)
NEUTROS PCT: 63 %
Platelets: 238 10*3/uL (ref 150–400)
RBC: 4.32 MIL/uL (ref 3.87–5.11)
RDW: 12.8 % (ref 11.5–15.5)
WBC: 5.6 10*3/uL (ref 4.0–10.5)

## 2016-06-09 LAB — COMPREHENSIVE METABOLIC PANEL
ALBUMIN: 4 g/dL (ref 3.5–5.0)
ALK PHOS: 85 U/L (ref 38–126)
ALT: 25 U/L (ref 14–54)
ANION GAP: 7 (ref 5–15)
AST: 21 U/L (ref 15–41)
BILIRUBIN TOTAL: 0.4 mg/dL (ref 0.3–1.2)
BUN: 16 mg/dL (ref 6–20)
CO2: 25 mmol/L (ref 22–32)
CREATININE: 0.89 mg/dL (ref 0.44–1.00)
Calcium: 10.4 mg/dL — ABNORMAL HIGH (ref 8.9–10.3)
Chloride: 105 mmol/L (ref 101–111)
GFR calc Af Amer: >60 mL/min (ref >60)
GFR calc non Af Amer: >60 mL/min (ref >60)
GLUCOSE: 169 mg/dL — AB (ref 65–99)
Potassium: 3.5 mmol/L (ref 3.5–5.1)
SODIUM: 137 mmol/L (ref 135–145)
Total Protein: 7.5 g/dL (ref 6.5–8.1)

## 2016-06-09 LAB — URINALYSIS, MICROSCOPIC (REFLEX)

## 2016-06-09 LAB — PREGNANCY, URINE: Preg Test, Ur: NEGATIVE

## 2016-06-09 LAB — LIPASE, BLOOD: Lipase: 711 U/L — ABNORMAL HIGH (ref 11–51)

## 2016-06-09 MED ORDER — SODIUM CHLORIDE 0.9 % IV BOLUS (SEPSIS)
1000.0000 mL | Freq: Once | INTRAVENOUS | Status: AC
  Administered 2016-06-09: 1000 mL via INTRAVENOUS

## 2016-06-09 MED ORDER — ONDANSETRON HCL 4 MG/2ML IJ SOLN
4.0000 mg | Freq: Once | INTRAMUSCULAR | Status: AC
  Administered 2016-06-09: 4 mg via INTRAVENOUS
  Filled 2016-06-09: qty 2

## 2016-06-09 MED ORDER — IOPAMIDOL (ISOVUE-300) INJECTION 61%
100.0000 mL | Freq: Once | INTRAVENOUS | Status: AC | PRN
  Administered 2016-06-09: 100 mL via INTRAVENOUS

## 2016-06-09 MED ORDER — GI COCKTAIL ~~LOC~~
30.0000 mL | Freq: Once | ORAL | Status: AC
  Administered 2016-06-09: 30 mL via ORAL
  Filled 2016-06-09: qty 30

## 2016-06-09 MED ORDER — MORPHINE SULFATE (PF) 4 MG/ML IV SOLN
4.0000 mg | Freq: Once | INTRAVENOUS | Status: AC
  Administered 2016-06-09: 4 mg via INTRAVENOUS
  Filled 2016-06-09: qty 1

## 2016-06-09 NOTE — Progress Notes (Signed)
Patient ID: Jessica Robertson, female   DOB: 08/15/1965, 51 y.o.   MRN: [id number]\  Accepted to MedSurg/Observation for acute pancreatitis.   [facility]  History                Chief Complaint    Chief Complaint  Patient presents with  . Abdominal Pain    HPI Jessica Robertson is a 51 y.o. female with a PMHx of DM, HTN, hyperlipidemia, who presents to the Emergency Department complaining of constant, gradually improving, epigastric abdominal pain onset yesterday. Pt reports associated feeling of needing to burp and nausea. Pt hasn't tried any medications for the relief of her symptoms. Pt reports that there is a discomfort to her epigastric area and no pain. Pt notes that her epigastric abdominal pain is not worsened with position change or eating. Pt denies CP, SOB, LOC, diarrhea, vomiting, sour taste in mouth, fever, chills, and any other symptoms. Pt notes that she has had a cholecystectomy and a tubal ligation. Pt denies consuming ETOH.    Lipase, blood [[id number]][[id number]] (Abnormal) Collected: 06/09/16 1645  Updated: 06/09/16 1757   Specimen Type: Blood   Specimen Source: Vein    Lipase 711 (H) U/L  Comprehensive metabolic panel [[id number]][[id number]] (Abnormal) Collected: 06/09/16 1645  Updated: 06/09/16 1732   Specimen Type: Blood   Specimen Source: Vein    Sodium 137 mmol/L   Potassium 3.5 mmol/L   Chloride 105 mmol/L   CO2 25 mmol/L   Glucose, Bld 169 (H) mg/dL   BUN 16 mg/dL   Creatinine, Ser 0.860.89 mg/dL   Calcium 57.810.4 (H) mg/dL   Total Protein 7.5 g/dL   Albumin 4.0 g/dL   AST 21 U/L   ALT 25 U/L   Alkaline Phosphatase 85 U/L   Total Bilirubin 0.4 mg/dL   GFR calc non Af Amer >60 mL/min   GFR calc Af Amer >60 mL/min   Anion gap 7  Pregnancy, urine [[id number]][[id number]] Collected: 06/09/16 1625  Updated: 06/09/16 1700   Specimen Type: Urine   Specimen Source: Urine, Clean Catch    Preg Test, Ur NEGATIVE  CBC with Differential [[id number]][[id number]] Collected: 06/09/16 1645  Updated:  06/09/16 1659   Specimen Type: Blood   Specimen Source: Vein    WBC 5.6 K/uL   RBC 4.32 MIL/uL   Hemoglobin 12.2 g/dL   HCT 27.237.4 %   MCV 53.686.6 fL   MCH 28.2 pg   MCHC 32.6 g/dL   RDW 64.412.8 %   Platelets 238 K/uL   Neutrophils Relative % 63 %   Neutro Abs 3.6 K/uL   Lymphocytes Relative 28 %   Lymphs Abs 1.6 K/uL   Monocytes Relative 8 %   Monocytes Absolute 0.4 K/uL   Eosinophils Relative 1 %   Eosinophils Absolute 0.1 K/uL   Basophils Relative 0 %   Basophils Absolute 0.0 K/uL  Urinalysis, Microscopic (reflex) [[id number]][[id number]] (Abnormal) Collected: 06/09/16 1625  Updated: 06/09/16 1650    RBC / HPF 0-5 RBC/hpf   WBC, UA 0-5 WBC/hpf   Bacteria, UA FEW (A)   Squamous Epithelial / LPF 0-5 (A)   Mucous PRESENT  Urinalysis, Routine w reflex microscopic [[id number]][[id number]] (Abnormal) Collected: 06/09/16 1625  Updated: 06/09/16 1650   Specimen Type: Urine   Specimen Source: Urine, Clean Catch    Color, Urine YELLOW   APPearance CLEAR   Specific Gravity, Urine 1.022   pH 5.5   Glucose, UA NEGATIVE mg/dL   Hgb urine dipstick NEGATIVE   Bilirubin Urine  NEGATIVE   Ketones, ur NEGATIVE mg/dL   Protein, ur 161 (A) mg/dL   Nitrite NEGATIVE   Leukocytes, UA NEGATIVE  CLINICAL DATA:  51 y/o  F; epigastric pain and elevated lipase.  EXAM: CT ABDOMEN AND PELVIS WITH CONTRAST  TECHNIQUE: Multidetector CT imaging of the abdomen and pelvis was performed using the standard protocol following bolus administration of intravenous contrast.  CONTRAST:  ISOVUE-300 IOPAMIDOL (ISOVUE-300) INJECTION 61%  COMPARISON:  None.  FINDINGS: Lower chest: No acute abnormality.  Hepatobiliary: 11 mm fluid attenuating focus in segment 8 of the liver is likely a cyst. No other focal liver abnormality. Status post cholecystectomy. No intra or extrahepatic biliary ductal dilatation.  Pancreas: There is fat stranding surrounding the pancreas from head to tail probably representing acute  pancreatitis. No main duct dilatation. No hypoenhancement or acute peripancreatic collection identified.  Spleen: Normal in size without focal abnormality.  Adrenals/Urinary Tract: Adrenal glands are unremarkable. Kidneys are normal, without renal calculi, focal lesion, or hydronephrosis. Bladder is unremarkable.  Stomach/Bowel: Stomach is within normal limits. Appendix appears normal. No evidence of bowel wall thickening, distention, or inflammatory changes.  Vascular/Lymphatic: Aortic atherosclerosis. No enlarged abdominal or pelvic lymph nodes.  Reproductive: Uterus and bilateral adnexa are unremarkable.  Other: No abdominal wall hernia or abnormality. No abdominopelvic ascites.  Musculoskeletal: No acute or significant osseous findings.  IMPRESSION: Diffuse edema surrounding the pancreas from head to tail compatible with acute pancreatitis. No main duct dilatation, evidence for necrosis, or acute peripancreatic collection identified.   Electronically Signed   By: Mitzi Hansen M.D.   On: 06/09/2016 19:58 ____________________________________________________________________________  Sanda Klein, MD [phone].

## 2016-06-09 NOTE — Progress Notes (Signed)
[facility] notified of patient arrival to floor.Dr.Kakrandy to come see patient.

## 2016-06-09 NOTE — ED Notes (Signed)
Patient transported to CT 

## 2016-06-09 NOTE — ED Triage Notes (Addendum)
C/o epigastric pain stareted yesterday-states feels like same pain as with gallstones-NAD-steady gait-also c/o pain to left foot that she feels is gout or plantar fasciitis

## 2016-06-09 NOTE — ED Provider Notes (Signed)
[facility] Provider Note   CSN: [id number] Arrival date & time: 06/09/16  1614   By signing my name below, I, Soijett Blue, attest that this documentation has been prepared under the direction and in the presence of Will Kamden Stanislaw, PA-C Electronically Signed: Soijett Blue, ED Scribe. 06/09/16. 5:54 PM.  History   Chief Complaint Chief Complaint  Patient presents with  . Abdominal Pain    HPI Jessica Robertson is a 51 y.o. female with a PMHx of DM, HTN, hyperlipidemia, who presents to the Emergency Department complaining of constant, gradually improving, epigastric abdominal pain onset yesterday. Pt reports associated feeling of needing to burp and nausea. Pt hasn't tried any medications for the relief of her symptoms. Pt reports that there is a discomfort to her epigastric area and no pain. Pt notes that her epigastric abdominal pain is not worsened with position change or eating. Pt notes that she has had a cholecystectomy and a tubal ligation. Pt denies consuming ETOH.  Pt denies CP, SOB, LOC, diarrhea, vomiting, sour taste in mouth, fever, chills, and any other symptoms.    The history is provided by the patient. No language interpreter was used.    Past Medical History:  Diagnosis Date  . Diabetes mellitus without complication (HCC)   . Gout   . Hyperlipemia   . Hypertension   . Plantar fasciitis     Patient Active Problem List   Diagnosis Date Noted  . Controlled type 2 diabetes mellitus with hyperglycemia (HCC) 06/10/2016  . Acute pancreatitis 06/09/2016  . Hypertension   . Hyperlipemia   . Acute Cholecystitis s/p lap cholecystectomy 12/10/2015 12/10/2015    Past Surgical History:  Procedure Laterality Date  . ablation    . CHOLECYSTECTOMY N/A 12/11/2015   Procedure: LAPAROSCOPIC CHOLECYSTECTOMY WITH INTRAOPERATIVE CHOLANGIOGRAM;  Surgeon: Luretha MurphyMatthew Martin, MD;  Location: [facility];  Service: General;  Laterality: N/A;  . LYMPH NODE DISSECTION    . TUBAL LIGATION       OB History    No data available       Home Medications    Prior to Admission medications   Medication Sig Start Date End Date Taking? Authorizing Provider  METFORMIN HCL PO Take by mouth.   Yes Historical Provider, MD  amLODipine (NORVASC) 5 MG tablet Take 5 mg by mouth daily. 12/01/15   Historical Provider, MD  atorvastatin (LIPITOR) 20 MG tablet Take 20 mg by mouth daily.    Historical Provider, MD  lisinopril (PRINIVIL,ZESTRIL) 40 MG tablet Take 40 mg by mouth daily.    Historical Provider, MD  metoprolol succinate (TOPROL-XL) 50 MG 24 hr tablet Take 50 mg by mouth daily. Take with or immediately following a meal.    Historical Provider, MD    Family History Family History  Problem Relation Age of Onset  . Hypertension Other     Social History Social History  Substance Use Topics  . Smoking status: Never Smoker  . Smokeless tobacco: Never Used  . Alcohol use No     Allergies   Patient has no known allergies.   Review of Systems Review of Systems  Constitutional: Negative for chills and fever.  HENT: Negative for congestion and sore throat.   Eyes: Negative for visual disturbance.  Respiratory: Negative for cough, shortness of breath and wheezing.   Cardiovascular: Negative for chest pain and palpitations.  Gastrointestinal: Positive for abdominal pain (epigastric) and nausea. Negative for blood in stool, diarrhea and vomiting.  Genitourinary: Negative for dysuria.  Musculoskeletal: Negative for back pain and neck pain.  Skin: Negative for rash.  Neurological: Negative for syncope and headaches.     Physical Exam Updated Vital Signs BP 128/69 (BP Location: Right Arm)   Pulse 76   Temp 98.4 F (36.9 C) (Oral)   Resp 13   Ht 5\' 4"  (1.626 m)   Wt 103 kg   SpO2 97%   BMI 38.98 kg/m   Physical Exam  Constitutional: She appears well-developed and well-nourished. No distress.  Nontoxic appearing.  HENT:  Head: Normocephalic and atraumatic.    Mouth/Throat: Oropharynx is clear and moist.  Eyes: Conjunctivae are normal. Pupils are equal, round, and reactive to light. Right eye exhibits no discharge. Left eye exhibits no discharge.  Neck: Neck supple.  Cardiovascular: Normal rate, regular rhythm, normal heart sounds and intact distal pulses.  Exam reveals no gallop and no friction rub.   No murmur heard. Pulmonary/Chest: Effort normal and breath sounds normal. No respiratory distress. She has no wheezes. She has no rales.  Abdominal: Soft. Bowel sounds are normal. She exhibits no distension and no mass. There is tenderness in the epigastric area. There is no rebound and no guarding.  Mild epigastric tenderness. No peritoneal signs.   Musculoskeletal: She exhibits no edema.  Lymphadenopathy:    She has no cervical adenopathy.  Neurological: She is alert. Coordination normal.  Skin: Skin is warm and dry. Capillary refill takes less than 2 seconds. No rash noted. She is not diaphoretic. No erythema. No pallor.  Psychiatric: She has a normal mood and affect. Her behavior is normal.  Nursing note and vitals reviewed.    ED Treatments / Results  DIAGNOSTIC STUDIES: Oxygen Saturation is 96% on RA, nl by my interpretation.    COORDINATION OF CARE: 5:50 PM Discussed treatment plan with pt at bedside which includes UA, labs, EKG, GI cocktail, follow up with PCP, and pt agreed to plan.   Labs (all labs ordered are listed, but only abnormal results are displayed) Labs Reviewed  URINALYSIS, ROUTINE W REFLEX MICROSCOPIC - Abnormal; Notable for the following:       Result Value   Protein, ur 100 (*)    All other components within normal limits  COMPREHENSIVE METABOLIC PANEL - Abnormal; Notable for the following:    Glucose, Bld 169 (*)    Calcium 10.4 (*)    All other components within normal limits  LIPASE, BLOOD - Abnormal; Notable for the following:    Lipase 711 (*)    All other components within normal limits  URINALYSIS,  MICROSCOPIC (REFLEX) - Abnormal; Notable for the following:    Bacteria, UA FEW (*)    Squamous Epithelial / LPF 0-5 (*)    All other components within normal limits  BASIC METABOLIC PANEL - Abnormal; Notable for the following:    Glucose, Bld 109 (*)    All other components within normal limits  CBC - Abnormal; Notable for the following:    Hemoglobin 11.1 (*)    HCT 35.8 (*)    All other components within normal limits  CBC WITH DIFFERENTIAL/PLATELET  PREGNANCY, URINE  TRIGLYCERIDES  HEPATIC FUNCTION PANEL  GLUCOSE, CAPILLARY  TROPONIN I    EKG  EKG Interpretation  Date/Time:  Thursday June 09 2016 16:39:14 EST Ventricular Rate:  83 PR Interval:    QRS Duration: 97 QT Interval:  374 QTC Calculation: 440 R Axis:   54 Text Interpretation:  Sinus rhythm Anterior infarct, old Borderline T abnormalities, inferior leads  No significant change since last tracing Confirmed by [facility] MD, ERIN ([id number]) on 06/09/2016 7:27:05 PM       Radiology Ct Abdomen Pelvis W Contrast  Result Date: 06/09/2016 CLINICAL DATA:  51 y/o  F; epigastric pain and elevated lipase. EXAM: CT ABDOMEN AND PELVIS WITH CONTRAST TECHNIQUE: Multidetector CT imaging of the abdomen and pelvis was performed using the standard protocol following bolus administration of intravenous contrast. CONTRAST:  ISOVUE-300 IOPAMIDOL (ISOVUE-300) INJECTION 61% COMPARISON:  None. FINDINGS: Lower chest: No acute abnormality. Hepatobiliary: 11 mm fluid attenuating focus in segment 8 of the liver is likely a cyst. No other focal liver abnormality. Status post cholecystectomy. No intra or extrahepatic biliary ductal dilatation. Pancreas: There is fat stranding surrounding the pancreas from head to tail probably representing acute pancreatitis. No main duct dilatation. No hypoenhancement or acute peripancreatic collection identified. Spleen: Normal in size without focal abnormality. Adrenals/Urinary Tract: Adrenal glands are  unremarkable. Kidneys are normal, without renal calculi, focal lesion, or hydronephrosis. Bladder is unremarkable. Stomach/Bowel: Stomach is within normal limits. Appendix appears normal. No evidence of bowel wall thickening, distention, or inflammatory changes. Vascular/Lymphatic: Aortic atherosclerosis. No enlarged abdominal or pelvic lymph nodes. Reproductive: Uterus and bilateral adnexa are unremarkable. Other: No abdominal wall hernia or abnormality. No abdominopelvic ascites. Musculoskeletal: No acute or significant osseous findings. IMPRESSION: Diffuse edema surrounding the pancreas from head to tail compatible with acute pancreatitis. No main duct dilatation, evidence for necrosis, or acute peripancreatic collection identified. Electronically Signed   By: Mitzi Hansen M.D.   On: 06/09/2016 19:58    Procedures Procedures (including critical care time)  Medications Ordered in ED Medications  acetaminophen (TYLENOL) tablet 650 mg (not administered)    Or  acetaminophen (TYLENOL) suppository 650 mg (not administered)  ondansetron (ZOFRAN) tablet 4 mg (not administered)    Or  ondansetron (ZOFRAN) injection 4 mg (not administered)  0.9 %  sodium chloride infusion ( Intravenous New Bag/Given 06/10/16 0022)  morphine 2 MG/ML injection 1 mg (1 mg Intravenous Given 06/10/16 0020)  insulin aspart (novoLOG) injection 0-9 Units (0 Units Subcutaneous Not Given 06/10/16 0020)  gi cocktail (Maalox,Lidocaine,Donnatal) (30 mLs Oral Given 06/09/16 1751)  sodium chloride 0.9 % bolus 1,000 mL (0 mLs Intravenous Stopped 06/09/16 1907)  ondansetron (ZOFRAN) injection 4 mg (4 mg Intravenous Given 06/09/16 1822)  iopamidol (ISOVUE-300) 61 % injection 100 mL (100 mLs Intravenous Contrast Given 06/09/16 1926)  sodium chloride 0.9 % bolus 1,000 mL (1,000 mLs Intravenous New Bag/Given 06/09/16 2121)  morphine 4 MG/ML injection 4 mg (4 mg Intravenous Given 06/09/16 2121)     Initial Impression / Assessment  and Plan / ED Course  I have reviewed the triage vital signs and the nursing notes.  Pertinent labs that were available during my care of the patient were reviewed by me and considered in my medical decision making (see chart for details).   This is a 51 y.o. female with a PMHx of DM, HTN, hyperlipidemia, who presents to the Emergency Department complaining of constant, gradually improving, epigastric abdominal pain onset yesterday. Pt reports associated feeling of needing to burp and nausea. Pt hasn't tried any medications for the relief of her symptoms. Pt reports that there is a discomfort to her epigastric area and no pain. Pt notes that her epigastric abdominal pain is not worsened with position change or eating. Pt notes that she has had a cholecystectomy and a tubal ligation. Pt denies consuming ETOH.   On exam patient is afebrile nontoxic appearing.  She has epigastric abdominal tenderness to palpation. No peritoneal signs. CMP is unremarkable. CBC is unremarkable.  Lipase is 711. No previous history of pancreatitis. No previous lipase elevations. CT abdomen and pelvis with contrast shows diffuse edema surrounding the pancreas from head to tail compatible with acute pancreatitis. No main duct dilation, evidence of necrosis or acute peripancreatic collection.  With this new pancreatitis will consult for admission to medicine. Patient agrees with plan for admission.  I consulted with Dr. Robb Matar who accepted the patient for admission and requested temp orders for observation to med surg bed.   This patient was discussed with Dr. Dalene Seltzer who agrees with assessment and plan.   Final Clinical Impressions(s) / ED Diagnoses   Final diagnoses:  Acute pancreatitis, unspecified complication status, unspecified pancreatitis type    New Prescriptions Current Discharge Medication List     I personally performed the services described in this documentation, which was scribed in my presence. The  recorded information has been reviewed and is accurate.      Everlene Farrier, PA-C 06/10/16 0150    Alvira Monday, MD 06/13/16 [phone]

## 2016-06-09 NOTE — ED Notes (Signed)
ED Provider at bedside. 

## 2016-06-09 NOTE — ED Notes (Signed)
Attempted to call report nurse not available

## 2016-06-10 ENCOUNTER — Encounter (HOSPITAL_COMMUNITY): Payer: Self-pay | Admitting: Internal Medicine

## 2016-06-10 DIAGNOSIS — I10 Essential (primary) hypertension: Secondary | ICD-10-CM | POA: Diagnosis not present

## 2016-06-10 DIAGNOSIS — T887XXA Unspecified adverse effect of drug or medicament, initial encounter: Secondary | ICD-10-CM | POA: Diagnosis not present

## 2016-06-10 DIAGNOSIS — K859 Acute pancreatitis without necrosis or infection, unspecified: Secondary | ICD-10-CM

## 2016-06-10 DIAGNOSIS — K8500 Idiopathic acute pancreatitis without necrosis or infection: Secondary | ICD-10-CM | POA: Diagnosis not present

## 2016-06-10 DIAGNOSIS — E1165 Type 2 diabetes mellitus with hyperglycemia: Secondary | ICD-10-CM | POA: Diagnosis not present

## 2016-06-10 LAB — BASIC METABOLIC PANEL
Anion gap: 6 (ref 5–15)
BUN: 12 mg/dL (ref 6–20)
CO2: 27 mmol/L (ref 22–32)
CREATININE: 0.89 mg/dL (ref 0.44–1.00)
Calcium: 10 mg/dL (ref 8.9–10.3)
Chloride: 106 mmol/L (ref 101–111)
GFR calc Af Amer: >60 mL/min (ref >60)
GFR calc non Af Amer: >60 mL/min (ref >60)
GLUCOSE: 109 mg/dL — AB (ref 65–99)
Potassium: 4 mmol/L (ref 3.5–5.1)
Sodium: 139 mmol/L (ref 135–145)

## 2016-06-10 LAB — LIPASE, BLOOD: LIPASE: 363 U/L — AB (ref 11–51)

## 2016-06-10 LAB — GLUCOSE, CAPILLARY
GLUCOSE-CAPILLARY: 87 mg/dL (ref 65–99)
GLUCOSE-CAPILLARY: 91 mg/dL (ref 65–99)
GLUCOSE-CAPILLARY: 98 mg/dL (ref 65–99)
Glucose-Capillary: 93 mg/dL (ref 65–99)
Glucose-Capillary: 76 mg/dL (ref 65–99)
Glucose-Capillary: 75 mg/dL (ref 65–99)

## 2016-06-10 LAB — CBC
HCT: 35.8 % — ABNORMAL LOW (ref 36.0–46.0)
Hemoglobin: 11.1 g/dL — ABNORMAL LOW (ref 12.0–15.0)
MCH: 27.3 pg (ref 26.0–34.0)
MCHC: 31 g/dL (ref 30.0–36.0)
MCV: 88 fL (ref 78.0–100.0)
PLATELETS: 225 10*3/uL (ref 150–400)
RBC: 4.07 MIL/uL (ref 3.87–5.11)
RDW: 13 % (ref 11.5–15.5)
WBC: 6.6 10*3/uL (ref 4.0–10.5)

## 2016-06-10 LAB — HEPATIC FUNCTION PANEL
ALK PHOS: 79 U/L (ref 38–126)
ALT: 29 U/L (ref 14–54)
AST: 32 U/L (ref 15–41)
Albumin: 3.6 g/dL (ref 3.5–5.0)
BILIRUBIN INDIRECT: 0.4 mg/dL (ref 0.3–0.9)
Bilirubin, Direct: 0.1 mg/dL (ref 0.1–0.5)
Total Bilirubin: 0.5 mg/dL (ref 0.3–1.2)
Total Protein: 6.9 g/dL (ref 6.5–8.1)

## 2016-06-10 LAB — TRIGLYCERIDES: Triglycerides: 124 mg/dL (ref <150)

## 2016-06-10 LAB — TROPONIN I: Troponin I: <0.03 ng/mL (ref <0.03)

## 2016-06-10 MED ORDER — ONDANSETRON HCL 4 MG/2ML IJ SOLN
4.0000 mg | Freq: Four times a day (QID) | INTRAMUSCULAR | Status: DC | PRN
  Administered 2016-06-13: 4 mg via INTRAVENOUS
  Filled 2016-06-10: qty 2

## 2016-06-10 MED ORDER — ACETAMINOPHEN 650 MG RE SUPP: 650.0000 mg | Freq: Four times a day (QID) | RECTAL | Status: DC | PRN

## 2016-06-10 MED ORDER — ACETAMINOPHEN 325 MG PO TABS: 650.0000 mg | ORAL_TABLET | Freq: Four times a day (QID) | ORAL | Status: DC | PRN

## 2016-06-10 MED ORDER — ONDANSETRON HCL 4 MG PO TABS: 4.0000 mg | ORAL_TABLET | Freq: Four times a day (QID) | ORAL | Status: DC | PRN

## 2016-06-10 MED ORDER — SODIUM CHLORIDE 0.9 % IV SOLN
INTRAVENOUS | Status: DC
  Administered 2016-06-11 – 2016-06-12 (×4): via INTRAVENOUS

## 2016-06-10 MED ORDER — INSULIN ASPART 100 UNIT/ML ~~LOC~~ SOLN: 0.0000 [IU] | SUBCUTANEOUS | Status: DC

## 2016-06-10 MED ORDER — SODIUM CHLORIDE 0.9 % IV SOLN
INTRAVENOUS | Status: DC
  Administered 2016-06-10 (×2): via INTRAVENOUS

## 2016-06-10 MED ORDER — MORPHINE SULFATE (PF) 2 MG/ML IV SOLN
1.0000 mg | INTRAVENOUS | Status: DC | PRN
  Administered 2016-06-10 – 2016-06-11 (×8): 1 mg via INTRAVENOUS
  Filled 2016-06-10 (×8): qty 1

## 2016-06-10 NOTE — Consult Note (Signed)
Referring Provider: [facility]  Primary Care Physician:  [name], [name], NP [facility]  Reason for Consultation: pancreatitis.    ASSESSMENT AND PLAN:  41. 51 year old female with acute pancreatitis. She has a history of cholelithiasis, s/p cholecystectomy last year but no evidence now for choledocholithiasis with normal CBD and normal LFTs. She could still have tiny stones / sludge and may need MRCP. Triglycerides are normal. Other considerations include medications. She takes a calcium channel blocker and was recently started on a statin and ACEI. Autoimmune pancreatitis less likely -home meds are on hold -She was fluid resuscitated and now on maintenance IVF at 125 ml / hr. No signs of hemoconcentration, hct 35, normal renal function -continue IVF, analgesics, anti-emetics. Considering MRCP.   HPI: [name] is a 51 y.o. female who presented to ED last night with severe upper abdominal pain and nausea. Her pain started Tuesday afternoon. She was able to manage until yesterday afternoon when pain became much worse. Husband brought her to ED where lipase was in 700's and CTscan showed pancreatitis. LFTs are normal. No CBD dilation. Patient s/p cholecystectomy last year. She doesn't drink ETOH,. No FMH of pancreatitis / pancreatitic cancer. She did recently start three new medications : metformin, lipitor and lisinopril. Patient gives a history of chronic hemorrhoidal bleeding. [facility].    Past Medical History:  Diagnosis Date  . Diabetes mellitus without complication (HCC)   . Gout   . Hyperlipemia   . Hypertension   . Plantar fasciitis     Past Surgical History:  Procedure Laterality Date  . ablation    . CHOLECYSTECTOMY N/A 12/11/2015   Procedure: LAPAROSCOPIC CHOLECYSTECTOMY WITH INTRAOPERATIVE CHOLANGIOGRAM;  Surgeon: Luretha MurphyMatthew Martin, MD;  Location: [facility];  Service: General;  Laterality: N/A;  .  LYMPH NODE DISSECTION    . TUBAL LIGATION      Prior to Admission medications   Medication Sig Start Date End Date Taking? Authorizing Provider  amLODipine (NORVASC) 5 MG tablet Take 5-10 mg by mouth daily. One tablet daily, take one more tablet if blood pressure over 140/80 12/01/15  Yes Historical Provider, MD  atorvastatin (LIPITOR) 20 MG tablet Take 20 mg by mouth daily.   Yes Historical Provider, MD  lisinopril (PRINIVIL,ZESTRIL) 40 MG tablet Take 40 mg by mouth daily.   Yes Historical Provider, MD  metFORMIN (GLUCOPHAGE) 500 MG tablet Take 500 mg by mouth daily.    Yes Historical Provider, MD  metoprolol succinate (TOPROL-XL) 50 MG 24 hr tablet Take 50 mg by mouth daily. Take with or immediately following a meal.   Yes Historical Provider, MD    Current Facility-Administered Medications  Medication Dose Route Frequency Provider Last Rate Last Dose  . 0.9 %  sodium chloride infusion   Intravenous Continuous Meredeth IdeGagan S Lama, MD      . acetaminophen (TYLENOL) tablet 650 mg  650 mg Oral Q6H PRN Eduard ClosArshad N Kakrakandy, MD       Or  . acetaminophen (TYLENOL) suppository 650 mg  650 mg Rectal Q6H PRN Eduard ClosArshad N Kakrakandy, MD      . insulin aspart (novoLOG) injection 0-9 Units  0-9 Units Subcutaneous Q4H Eduard ClosArshad N Kakrakandy, MD      . morphine 2 MG/ML injection 1 mg  1 mg Intravenous Q3H PRN Eduard ClosArshad N Kakrakandy, MD   1 mg at 06/10/16 1453  . ondansetron (ZOFRAN) tablet 4 mg  4 mg  Oral Q6H PRN Eduard Clos, MD       Or  . ondansetron [facility]) injection 4 mg  4 mg Intravenous Q6H PRN Eduard Clos, MD        Allergies as of 06/09/2016  . (No Known Allergies)    Family History  Problem Relation Age of Onset  . Hypertension Other     Social History   Social History  . Marital status: Married    Spouse name: N/A  . Number of children: N/A  . Years of education: N/A   Occupational History  . Not on file.   Social History Main Topics  . Smoking status: Never Smoker  .  Smokeless tobacco: Never Used  . Alcohol use No  . Drug use: No  . Sexual activity: Not on file   Other Topics Concern  . Not on file   Social History Narrative  . No narrative on file    Review of Systems: All systems reviewed and negative except where noted in HPI.  Physical Exam: Vital signs in last 24 hours: Temp:  [98.2 F (36.8 C)-98.6 F (37 C)] 98.2 F (36.8 C) (02/16 1500) Pulse Rate:  [71-89] 71 (02/16 1500) Resp:  [11-18] 18 (02/16 1500) BP: (125-154)/(69-83) 139/69 (02/16 1500) SpO2:  [93 %-97 %] 96 % (02/16 1500) Weight:  [227 lb 1.2 oz (103 kg)] 227 lb 1.2 oz (103 kg) (02/16 0000) Last BM Date: 06/09/16 General:   Alert,  Obese black female in NAD Head:  Normocephalic and atraumatic. Eyes:  Sclera clear, no icterus.   Conjunctiva pink. Ears:  Normal auditory acuity. Nose:  No deformity, discharge,  or lesions. Neck:  Supple; no masses  Lungs:  Clear throughout to auscultation.   No wheezes, crackles, or rhonchi.  Heart:  Regular rate and rhythm; no murmurs, clicks, rubs,  or gallops. Abdomen:  Soft, obese, moderate mid upper abdominal tenderness. A few bowel sounds. .   Rectal:  Deferred  Msk:  Symmetrical without gross deformities. . Pulses:  Normal pulses noted. Extremities:  Without clubbing or edema. Neurologic:  Alert and  oriented x4;  grossly normal neurologically. Skin:  Intact without significant lesions or rashes.. Psych:  Alert and cooperative. Normal mood and affect.  Intake/Output from previous day: 02/15 0701 - 02/16 0700 In: 263.3 [I.V.:263.3] Out: -  Intake/Output this shift: Total I/O In: 665 [I.V.:665] Out: -   Lab Results:  Recent Labs  06/09/16 1645 06/10/16 0007  WBC 5.6 6.6  HGB 12.2 11.1*  HCT 37.4 35.8*  PLT 238 225   BMET  Recent Labs  06/09/16 1645 06/10/16 0007  NA 137 139  K 3.5 4.0  CL 105 106  CO2 25 27  GLUCOSE 169* 109*  BUN 16 12  CREATININE 0.89 0.89  CALCIUM 10.4* 10.0   LFT  Recent  Labs  06/10/16 0007  PROT 6.9  ALBUMIN 3.6  AST 32  ALT 29  ALKPHOS 79  BILITOT 0.5  BILIDIR 0.1  IBILI 0.4     Studies/Results: Ct Abdomen Pelvis W Contrast  Result Date: 06/09/2016 CLINICAL DATA:  51 y/o  F; epigastric pain and elevated lipase. EXAM: CT ABDOMEN AND PELVIS WITH CONTRAST TECHNIQUE: Multidetector CT imaging of the abdomen and pelvis was performed using the standard protocol following bolus administration of intravenous contrast. CONTRAST:  ISOVUE-300 IOPAMIDOL (ISOVUE-300) INJECTION 61% COMPARISON:  None. FINDINGS: Lower chest: No acute abnormality. Hepatobiliary: 11 mm fluid attenuating focus in segment 8 of the liver is  likely a cyst. No other focal liver abnormality. Status post cholecystectomy. No intra or extrahepatic biliary ductal dilatation. Pancreas: There is fat stranding surrounding the pancreas from head to tail probably representing acute pancreatitis. No main duct dilatation. No hypoenhancement or acute peripancreatic collection identified. Spleen: Normal in size without focal abnormality. Adrenals/Urinary Tract: Adrenal glands are unremarkable. Kidneys are normal, without renal calculi, focal lesion, or hydronephrosis. Bladder is unremarkable. Stomach/Bowel: Stomach is within normal limits. Appendix appears normal. No evidence of bowel wall thickening, distention, or inflammatory changes. Vascular/Lymphatic: Aortic atherosclerosis. No enlarged abdominal or pelvic lymph nodes. Reproductive: Uterus and bilateral adnexa are unremarkable. Other: No abdominal wall hernia or abnormality. No abdominopelvic ascites. Musculoskeletal: No acute or significant osseous findings. IMPRESSION: Diffuse edema surrounding the pancreas from head to tail compatible with acute pancreatitis. No main duct dilatation, evidence for necrosis, or acute peripancreatic collection identified. Electronically Signed   By: Mitzi Hansen M.D.   On: 06/09/2016 19:58    Willette Cluster,  NP-C @  06/10/2016, 4:01 PM  Pager number [phone]

## 2016-06-10 NOTE — H&P (Addendum)
History and Physical    Cambreigh Dearing ZOX:[id number] DOB: [birth year] DOA: 06/09/2016  PCP: Redmond School, NP  Patient coming from: Home.  Chief Complaint: Abdominal pain.  HPI: Jessica Robertson is a 51 y.o. female with history of diabetes mellitus type 2, hypertension presented to the ER because of abdominal pain. Patient has been experiencing epigastric pain with nausea for the last 2 days. Denies any diarrhea. Denies any fever or chills. In the ER labs revealed elevated lipase with CT scan showing pancreatic inflammation. Patient is being admitted for further management of acute pancreatitis. Patient denies drinking alcohol. Has had cholecystectomy last year. CT scan does not show any findings for any obstructing gallstones. LFTs are normal.   ED Course: Lipase is elevated. CT scan shows acute pancreatitis.  Review of Systems: As per HPI, rest all negative.   Past Medical History:  Diagnosis Date  . Diabetes mellitus without complication (HCC)   . Gout   . Hyperlipemia   . Hypertension   . Plantar fasciitis     Past Surgical History:  Procedure Laterality Date  . ablation    . CHOLECYSTECTOMY N/A 12/11/2015   Procedure: LAPAROSCOPIC CHOLECYSTECTOMY WITH INTRAOPERATIVE CHOLANGIOGRAM;  Surgeon: Luretha Murphy, MD;  Location: [facility];  Service: General;  Laterality: N/A;  . LYMPH NODE DISSECTION    . TUBAL LIGATION       reports that she has never smoked. She has never used smokeless tobacco. She reports that she does not drink alcohol or use drugs.  No Known Allergies  Family History  Problem Relation Age of Onset  . Hypertension Other     Prior to Admission medications   Medication Sig Start Date End Date Taking? Authorizing Provider  METFORMIN HCL PO Take by mouth.   Yes Historical Provider, MD  amLODipine (NORVASC) 5 MG tablet Take 5 mg by mouth daily. 12/01/15   Historical Provider, MD  atorvastatin (LIPITOR) 20 MG tablet Take 20 mg by mouth daily.    Historical  Provider, MD  lisinopril (PRINIVIL,ZESTRIL) 40 MG tablet Take 40 mg by mouth daily.    Historical Provider, MD  metoprolol succinate (TOPROL-XL) 50 MG 24 hr tablet Take 50 mg by mouth daily. Take with or immediately following a meal.    Historical Provider, MD    Physical Exam: Vitals:   06/09/16 2107 06/09/16 2108 06/09/16 2200 06/09/16 2314  BP: 131/83 131/83 125/81 128/69  Pulse:  86 77 76  Resp:  16 11 13   Temp:  98.2 F (36.8 C)  98.4 F (36.9 C)  TempSrc:  Oral  Oral  SpO2:  93% 94% 97%      Constitutional: Moderately built and nourished. Vitals:   06/09/16 2107 06/09/16 2108 06/09/16 2200 06/09/16 2314  BP: 131/83 131/83 125/81 128/69  Pulse:  86 77 76  Resp:  16 11 13   Temp:  98.2 F (36.8 C)  98.4 F (36.9 C)  TempSrc:  Oral  Oral  SpO2:  93% 94% 97%   Eyes: Anicteric. no pallor. ENMT: No discharge from the ears eyes nose and mouth. Neck: No mass felt. No neck rigidity. Respiratory: No rhonchi or crepitations. Cardiovascular: S1 and S2 heard. Abdomen: Epigastric tenderness no guarding or rigidity. Musculoskeletal: No edema. No joint effusion. Skin: No rash. Skin appears warm. Neurologic: Alert awake oriented to time place and person. Moves all extremities. Psychiatric: Appears normal. Normal affect.   Labs on Admission: I have personally reviewed following labs and imaging studies  CBC:  Recent  Labs Lab 06/09/16 1645  WBC 5.6  NEUTROABS 3.6  HGB 12.2  HCT 37.4  MCV 86.6  PLT 238   Basic Metabolic Panel:  Recent Labs Lab 06/09/16 1645  NA 137  K 3.5  CL 105  CO2 25  GLUCOSE 169*  BUN 16  CREATININE 0.89  CALCIUM 10.4*   GFR: CrCl cannot be calculated (Unknown ideal weight.). Liver Function Tests:  Recent Labs Lab 06/09/16 1645  AST 21  ALT 25  ALKPHOS 85  BILITOT 0.4  PROT 7.5  ALBUMIN 4.0    Recent Labs Lab 06/09/16 1645  LIPASE 711*   No results for input(s): AMMONIA in the last 168 hours. Coagulation Profile: No  results for input(s): INR, PROTIME in the last 168 hours. Cardiac Enzymes: No results for input(s): CKTOTAL, CKMB, CKMBINDEX, TROPONINI in the last 168 hours. BNP (last 3 results) No results for input(s): PROBNP in the last 8760 hours. HbA1C: No results for input(s): HGBA1C in the last 72 hours. CBG: No results for input(s): GLUCAP in the last 168 hours. Lipid Profile: No results for input(s): CHOL, HDL, LDLCALC, TRIG, CHOLHDL, LDLDIRECT in the last 72 hours. Thyroid Function Tests: No results for input(s): TSH, T4TOTAL, FREET4, T3FREE, THYROIDAB in the last 72 hours. Anemia Panel: No results for input(s): VITAMINB12, FOLATE, FERRITIN, TIBC, IRON, RETICCTPCT in the last 72 hours. Urine analysis:    Component Value Date/Time   COLORURINE YELLOW 06/09/2016 1625   APPEARANCEUR CLEAR 06/09/2016 1625   LABSPEC 1.022 06/09/2016 1625   PHURINE 5.5 06/09/2016 1625   GLUCOSEU NEGATIVE 06/09/2016 1625   HGBUR NEGATIVE 06/09/2016 1625   BILIRUBINUR NEGATIVE 06/09/2016 1625   KETONESUR NEGATIVE 06/09/2016 1625   PROTEINUR 100 (A) 06/09/2016 1625   NITRITE NEGATIVE 06/09/2016 1625   LEUKOCYTESUR NEGATIVE 06/09/2016 1625   Sepsis Labs: @LABRCNTIP (procalcitonin:4,lacticidven:4) )No results found for this or any previous visit (from the past 240 hour(s)).   Radiological Exams on Admission: Ct Abdomen Pelvis W Contrast  Result Date: 06/09/2016 CLINICAL DATA:  51 y/o  F; epigastric pain and elevated lipase. EXAM: CT ABDOMEN AND PELVIS WITH CONTRAST TECHNIQUE: Multidetector CT imaging of the abdomen and pelvis was performed using the standard protocol following bolus administration of intravenous contrast. CONTRAST:  ISOVUE-300 IOPAMIDOL (ISOVUE-300) INJECTION 61% COMPARISON:  None. FINDINGS: Lower chest: No acute abnormality. Hepatobiliary: 11 mm fluid attenuating focus in segment 8 of the liver is likely a cyst. No other focal liver abnormality. Status post cholecystectomy. No intra or  extrahepatic biliary ductal dilatation. Pancreas: There is fat stranding surrounding the pancreas from head to tail probably representing acute pancreatitis. No main duct dilatation. No hypoenhancement or acute peripancreatic collection identified. Spleen: Normal in size without focal abnormality. Adrenals/Urinary Tract: Adrenal glands are unremarkable. Kidneys are normal, without renal calculi, focal lesion, or hydronephrosis. Bladder is unremarkable. Stomach/Bowel: Stomach is within normal limits. Appendix appears normal. No evidence of bowel wall thickening, distention, or inflammatory changes. Vascular/Lymphatic: Aortic atherosclerosis. No enlarged abdominal or pelvic lymph nodes. Reproductive: Uterus and bilateral adnexa are unremarkable. Other: No abdominal wall hernia or abnormality. No abdominopelvic ascites. Musculoskeletal: No acute or significant osseous findings. IMPRESSION: Diffuse edema surrounding the pancreas from head to tail compatible with acute pancreatitis. No main duct dilatation, evidence for necrosis, or acute peripancreatic collection identified. Electronically Signed   By: Mitzi Hansen M.D.   On: 06/09/2016 19:58     Assessment/Plan Principal Problem:   Acute pancreatitis Active Problems:   Hypertension   Controlled type 2 diabetes mellitus  with hyperglycemia (HCC)    1. Acute pancreatitis - cause not clear. Patient denies drinking alcohol. CT scan does not show any obstructing gallstones. LFTs are normal. Denies having had any new medications recently. Will check triglyceride levels. Will keep patient nothing by mouth for now. Continue with hydration and pain medications. If no cause found then hold Lisinopril. 2. Hypertension - since patient is nothing by mouth I have placed patient on when necessary IV hydralazine. 3. Diabetes mellitus type 2 - will keep patient on sliding scale coverage.   DVT prophylaxis: SCDs. Code Status: Full code.  Family  Communication: Discussed with patient.  Disposition Plan: Home.  Consults called: None.  Admission status: Observation.    Eduard ClosKAKRAKANDY,ARSHAD N. MD [facility] Pager [phone].  If 7PM-7AM, please contact night-coverage [email or web address] Password [email or web address]  06/10/2016, 12:04 AM

## 2016-06-10 NOTE — Progress Notes (Signed)
Subjective: Patient admitted this morning, see detailed H&P by Dr Toniann FailKakrakandy 51 y.o. female with history of diabetes mellitus type 2, hypertension presented to the ER because of abdominal pain. Patient has been experiencing epigastric pain with nausea for the last 2 days. Denies any diarrhea. Denies any fever or chills. In the ER labs revealed elevated lipase with CT scan showing pancreatic inflammation. Patient is being admitted for further management of acute pancreatitis. Patient denies drinking alcohol. Has had cholecystectomy last year. CT scan does not show any findings for any obstructing gallstones. LFTs are normal.   This morning patient complains of abdominal pain. Lipase is now down to 363. Triglycerides 124.   Vitals:   06/09/16 2314 06/10/16 0415  BP: 128/69 137/69  Pulse: 76 78  Resp: 13 18  Temp: 98.4 F (36.9 C) 98.6 F (37 C)   On exam- positive epigastric tenderness to palpation.   A/P  Acute pancreatitis Hypertension Diabetes mellitus  Continue nothing by mouth, IV normal saline at 125 ML per hour Would check lipase in a.m. I've consulted GI, to  ascertain the cause of pancreatitis.    Meredeth IdeGagan S Dael Howland [facility] [phone]- [phone]

## 2016-06-11 DIAGNOSIS — K8500 Idiopathic acute pancreatitis without necrosis or infection: Secondary | ICD-10-CM | POA: Diagnosis not present

## 2016-06-11 DIAGNOSIS — T887XXA Unspecified adverse effect of drug or medicament, initial encounter: Secondary | ICD-10-CM

## 2016-06-11 LAB — COMPREHENSIVE METABOLIC PANEL
ALBUMIN: 3.3 g/dL — AB (ref 3.5–5.0)
ALT: 20 U/L (ref 14–54)
ANION GAP: 5 (ref 5–15)
AST: 16 U/L (ref 15–41)
Alkaline Phosphatase: 77 U/L (ref 38–126)
BUN: 8 mg/dL (ref 6–20)
CHLORIDE: 108 mmol/L (ref 101–111)
CO2: 26 mmol/L (ref 22–32)
Calcium: 9.8 mg/dL (ref 8.9–10.3)
Creatinine, Ser: 0.79 mg/dL (ref 0.44–1.00)
GFR calc Af Amer: >60 mL/min (ref >60)
GFR calc non Af Amer: >60 mL/min (ref >60)
GLUCOSE: 92 mg/dL (ref 65–99)
POTASSIUM: 4 mmol/L (ref 3.5–5.1)
SODIUM: 139 mmol/L (ref 135–145)
Total Bilirubin: 0.9 mg/dL (ref 0.3–1.2)
Total Protein: 6.7 g/dL (ref 6.5–8.1)

## 2016-06-11 LAB — GLUCOSE, CAPILLARY
GLUCOSE-CAPILLARY: 141 mg/dL — AB (ref 65–99)
GLUCOSE-CAPILLARY: 84 mg/dL (ref 65–99)
GLUCOSE-CAPILLARY: 114 mg/dL — AB (ref 65–99)
Glucose-Capillary: 78 mg/dL (ref 65–99)
Glucose-Capillary: 98 mg/dL (ref 65–99)

## 2016-06-11 LAB — LIPASE, BLOOD: Lipase: 165 U/L — ABNORMAL HIGH (ref 11–51)

## 2016-06-11 MED ORDER — MORPHINE SULFATE (PF) 2 MG/ML IV SOLN
1.0000 mg | INTRAVENOUS | Status: DC | PRN
  Administered 2016-06-11 – 2016-06-12 (×5): 1 mg via INTRAVENOUS
  Filled 2016-06-11 (×6): qty 1

## 2016-06-11 NOTE — Progress Notes (Signed)
[facility] ROUNDING NOTE   Subjective: Tolerating clears well Continues to complain of abdominal discomfort in epigastric region, is somewhat better   Objective: Vital signs in last 24 hours: Temp:  [98.1 F (36.7 C)-98.7 F (37.1 C)] 98.4 F (36.9 C) (02/17 0424) Pulse Rate:  [71-86] 86 (02/17 0424) Resp:  [18] 18 (02/17 0424) BP: (131-146)/(69-77) 144/77 (02/17 0424) SpO2:  [94 %-96 %] 96 % (02/17 0424) Last BM Date: 06/09/16 General: NAD, obese Lungs: Clear Heart: S1-S2 Abdomen: Soft, no distention and mild tenderness in the epigastric and left upper quadrant Ext: No edema  Intake/Output from previous day: 02/16 0701 - 02/17 0700 In: 1250.4 [I.V.:1250.4] Out: -  Intake/Output this shift: No intake/output data recorded.   Lab Results:  Recent Labs  06/09/16 1645 06/10/16 0007  WBC 5.6 6.6  HGB 12.2 11.1*  PLT 238 225  MCV 86.6 88.0   BMET  Recent Labs  06/09/16 1645 06/10/16 0007 06/11/16 0522  NA 137 139 139  K 3.5 4.0 4.0  CL 105 106 108  CO2 25 27 26   GLUCOSE 169* 109* 92  BUN 16 12 8   CREATININE 0.89 0.89 0.79  CALCIUM 10.4* 10.0 9.8   LFT  Recent Labs  06/09/16 1645 06/10/16 0007 06/11/16 0522  PROT 7.5 6.9 6.7  ALBUMIN 4.0 3.6 3.3*  AST 21 32 16  ALT 25 29 20   ALKPHOS 85 79 77  BILITOT 0.4 0.5 0.9  BILIDIR  --  0.1  --   IBILI  --  0.4  --    PT/INR No results for input(s): INR in the last 72 hours.    Imaging/Other results: Ct Abdomen Pelvis W Contrast  Result Date: 06/09/2016 CLINICAL DATA:  51 y/o  F; epigastric pain and elevated lipase. EXAM: CT ABDOMEN AND PELVIS WITH CONTRAST TECHNIQUE: Multidetector CT imaging of the abdomen and pelvis was performed using the standard protocol following bolus administration of intravenous contrast. CONTRAST:  ISOVUE-300 IOPAMIDOL (ISOVUE-300) INJECTION 61% COMPARISON:  None. FINDINGS: Lower chest: No acute abnormality. Hepatobiliary: 11 mm fluid attenuating focus in  segment 8 of the liver is likely a cyst. No other focal liver abnormality. Status post cholecystectomy. No intra or extrahepatic biliary ductal dilatation. Pancreas: There is fat stranding surrounding the pancreas from head to tail probably representing acute pancreatitis. No main duct dilatation. No hypoenhancement or acute peripancreatic collection identified. Spleen: Normal in size without focal abnormality. Adrenals/Urinary Tract: Adrenal glands are unremarkable. Kidneys are normal, without renal calculi, focal lesion, or hydronephrosis. Bladder is unremarkable. Stomach/Bowel: Stomach is within normal limits. Appendix appears normal. No evidence of bowel wall thickening, distention, or inflammatory changes. Vascular/Lymphatic: Aortic atherosclerosis. No enlarged abdominal or pelvic lymph nodes. Reproductive: Uterus and bilateral adnexa are unremarkable. Other: No abdominal wall hernia or abnormality. No abdominopelvic ascites. Musculoskeletal: No acute or significant osseous findings. IMPRESSION: Diffuse edema surrounding the pancreas from head to tail compatible with acute pancreatitis. No main duct dilatation, evidence for necrosis, or acute peripancreatic collection identified. Electronically Signed   By: Mitzi Hansen M.D.   On: 06/09/2016 19:58   Assessment &Plan   51 year old female admitted with acute pancreatitis. She is status post cholecystectomy for cholelithiasis, did not have any evidence of choledocholithiasis on ultrasound or intraoperative cholangiogram in August 2017. CT abdomen and pelvis with contrast did not show any intrahepatic or CBD dilation, less likely to be biliary pancreatitis Lipase is slowly trending down, can hold off checking serial lipase Clinically she appears to be improving, mild  pancreatitis likely drug induced secondary to lisinopril Follow-up ANA and IgG 4 Continue clears for today and advance diet slowly as tolerated Pain control when necessary Repeat  maintaining MRCP in 6-8 weeks after resolution of symptoms as outpatient to exclude neoplastic lesion, advised patient to [facility] [place] We will sign off, please call with any questions  K. Scherry RanVeena Carsin Randazzo , MD [phone] Mon-Fri 8a-5p [phone] after 5p, weekends, holidays [facility]

## 2016-06-11 NOTE — Progress Notes (Signed)
PROGRESS NOTE    Jessica Robertson  ZOX:[id number] DOB: 1966/03/05 DOA: 06/09/2016 PCP: Redmond School, NP   Brief Narrative:  (Start on day 1 of progress note - keep it brief and live) Abdominal pain.  HPI: Jessica Robertson is a 51 y.o. female with history of diabetes mellitus type 2, hypertension presented to the ER because of abdominal pain. Patient has been experiencing epigastric pain with nausea for the last 2 days. Denies any diarrhea. Denies any fever or chills. In the ER labs revealed elevated lipase with CT scan showing pancreatic inflammation. Patient is being admitted for further management of acute pancreatitis. Patient denies drinking alcohol. Has had cholecystectomy last year. CT scan does not show any findings for any obstructing gallstones. LFTs are normal.   Assessment & Plan:   Principal Problem:   Acute pancreatitis Active Problems:   Hypertension   Controlled type 2 diabetes mellitus with hyperglycemia (HCC)  Acute pancreatitis - ?etiology. Patient denies drinking alcohol. CT scan does not show any obstructing gallstones. LFTs are normal. Trig- 124.  Pt is on lisinopril.  Patient still having abdominal pain. - Continue clear liquid diet  - Continue with hydration 125cc/hr - Increase frequency IV morphine now 2 mg every 2 hourly - Hold lisinopril - Appreciate GI evaluation, follow-up ENA and IgG4, repeat MRCP in 6-8 weeks after resolution of symptoms as outpatient to exclude neoplastic lesion, follow-up with her GI in [place] on discharge. ?lisinopril as etiology.  Hypertension - since patient is nothing by mouth I have placed patient on when necessary IV hydralazine.  Diabetes mellitus type 2 - will keep patient on sliding scale coverage.  DVT prophylaxis: SCDs. Code Status: Full code.  Family Communication: Discussed with patient.  Disposition Plan: Home.  Admission status: Observation.   Consultants:    GI  Antimicrobials:  None  Subjective: Complaints of epigastric pain, improves with IV morphine but pain returns after 2 hours.  Objective: Vitals:   06/10/16 2022 06/11/16 0031 06/11/16 0424 06/11/16 1414  BP: (!) 146/72 131/76 (!) 144/77 (!) 162/85  Pulse: 71 78 86 89  Resp: 18 18 18  (!) 185  Temp: 98.1 F (36.7 C) 98.7 F (37.1 C) 98.4 F (36.9 C) 99.3 F (37.4 C)  TempSrc: Oral Oral Oral Oral  SpO2: 96% 94% 96% 98%  Weight:      Height:        Intake/Output Summary (Last 24 hours) at 06/11/16 1755 Last data filed at 06/11/16 1414  Gross per 24 hour  Intake          1301.42 ml  Output                0 ml  Net          1301.42 ml   Filed Weights   06/10/16 0000  Weight: 103 kg (227 lb 1.2 oz)    Examination:  General exam: Appears calm and comfortable  Respiratory system: Clear to auscultation. Respiratory effort normal. Cardiovascular system: S1 & S2 heard, RRR. No JVD, murmurs, rubs, gallops or clicks. No pedal edema. Gastrointestinal system: Abdomen is nondistended, soft and Tenderness in the epigastric region. Reduced bowel sounds  Central nervous system: Alert and oriented. No focal neurological deficits. Extremities: Symmetric 5 x 5 power. Skin: No rashes, lesions or ulcers Psychiatry: Judgement and insight appear normal. Mood & affect appropriate.   Data Reviewed: I have personally reviewed following labs and imaging studies  CBC:  Recent Labs Lab 06/09/16 1645 06/10/16 0007  WBC 5.6 6.6  NEUTROABS 3.6  --   HGB 12.2 11.1*  HCT 37.4 35.8*  MCV 86.6 88.0  PLT 238 225   Basic Metabolic Panel:  Recent Labs Lab 06/09/16 1645 06/10/16 0007 06/11/16 0522  NA 137 139 139  K 3.5 4.0 4.0  CL 105 106 108  CO2 25 27 26   GLUCOSE 169* 109* 92  BUN 16 12 8   CREATININE 0.89 0.89 0.79  CALCIUM 10.4* 10.0 9.8   Liver Function Tests:  Recent Labs Lab 06/09/16 1645 06/10/16 0007 06/11/16 0522  AST 21 32 16  ALT 25 29 20   ALKPHOS 85 79  77  BILITOT 0.4 0.5 0.9  PROT 7.5 6.9 6.7  ALBUMIN 4.0 3.6 3.3*    Recent Labs Lab 06/09/16 1645 06/10/16 0947 06/11/16 0522  LIPASE 711* 363* 165*   Cardiac Enzymes:  Recent Labs Lab 06/10/16 0007  TROPONINI <0.03   CBG:  Recent Labs Lab 06/11/16 0005 06/11/16 0437 06/11/16 0818 06/11/16 1219 06/11/16 1619  GLUCAP 98 78 141* 84 114*   Lipid Profile:  Recent Labs  06/10/16 0007  TRIG 124   Radiology Studies: Ct Abdomen Pelvis W Contrast  Result Date: 06/09/2016 CLINICAL DATA:  51 y/o  F; epigastric pain and elevated lipase. EXAM: CT ABDOMEN AND PELVIS WITH CONTRAST TECHNIQUE: Multidetector CT imaging of the abdomen and pelvis was performed using the standard protocol following bolus administration of intravenous contrast. CONTRAST:  100mL ISOVUE-300 IOPAMIDOL (ISOVUE-300) INJECTION 61% COMPARISON:  None. FINDINGS: Lower chest: No acute abnormality. Hepatobiliary: 11 mm fluid attenuating focus in segment 8 of the liver is likely a cyst. No other focal liver abnormality. Status post cholecystectomy. No intra or extrahepatic biliary ductal dilatation. Pancreas: There is fat stranding surrounding the pancreas from head to tail probably representing acute pancreatitis. No main duct dilatation. No hypoenhancement or acute peripancreatic collection identified. Spleen: Normal in size without focal abnormality. Adrenals/Urinary Tract: Adrenal glands are unremarkable. Kidneys are normal, without renal calculi, focal lesion, or hydronephrosis. Bladder is unremarkable. Stomach/Bowel: Stomach is within normal limits. Appendix appears normal. No evidence of bowel wall thickening, distention, or inflammatory changes. Vascular/Lymphatic: Aortic atherosclerosis. No enlarged abdominal or pelvic lymph nodes. Reproductive: Uterus and bilateral adnexa are unremarkable. Other: No abdominal wall hernia or abnormality. No abdominopelvic ascites. Musculoskeletal: No acute or significant osseous  findings. IMPRESSION: Diffuse edema surrounding the pancreas from head to tail compatible with acute pancreatitis. No main duct dilatation, evidence for necrosis, or acute peripancreatic collection identified. Electronically Signed   By: Mitzi HansenLance  Furusawa-Stratton M.D.   On: 06/09/2016 19:58    Scheduled Meds: . insulin aspart  0-9 Units Subcutaneous Q4H   Continuous Infusions: . sodium chloride 125 mL/hr at 06/11/16 1726     LOS: 1 day   Onnie BoerEjiroghene E Jailah Willis, MD [facility] Pager [phone]  If 7PM-7AM, please contact night-coverage [email or web address] Password [email or web address] 06/11/2016, 5:55 PM

## 2016-06-12 DIAGNOSIS — K8500 Idiopathic acute pancreatitis without necrosis or infection: Secondary | ICD-10-CM | POA: Diagnosis not present

## 2016-06-12 LAB — COMPREHENSIVE METABOLIC PANEL
ALBUMIN: 3.5 g/dL (ref 3.5–5.0)
ALK PHOS: 78 U/L (ref 38–126)
ALT: 19 U/L (ref 14–54)
AST: 17 U/L (ref 15–41)
Anion gap: 8 (ref 5–15)
BILIRUBIN TOTAL: 0.5 mg/dL (ref 0.3–1.2)
BUN: 5 mg/dL — AB (ref 6–20)
CALCIUM: 9.9 mg/dL (ref 8.9–10.3)
CO2: 25 mmol/L (ref 22–32)
Chloride: 107 mmol/L (ref 101–111)
Creatinine, Ser: 0.71 mg/dL (ref 0.44–1.00)
GFR calc Af Amer: >60 mL/min (ref >60)
GLUCOSE: 102 mg/dL — AB (ref 65–99)
POTASSIUM: 3.8 mmol/L (ref 3.5–5.1)
Sodium: 140 mmol/L (ref 135–145)
TOTAL PROTEIN: 7.1 g/dL (ref 6.5–8.1)

## 2016-06-12 LAB — CBC
HEMATOCRIT: 35.3 % — AB (ref 36.0–46.0)
HEMOGLOBIN: 11.2 g/dL — AB (ref 12.0–15.0)
MCH: 27.5 pg (ref 26.0–34.0)
MCHC: 31.7 g/dL (ref 30.0–36.0)
MCV: 86.7 fL (ref 78.0–100.0)
Platelets: 223 10*3/uL (ref 150–400)
RBC: 4.07 MIL/uL (ref 3.87–5.11)
RDW: 12.7 % (ref 11.5–15.5)
WBC: 3.9 10*3/uL — AB (ref 4.0–10.5)

## 2016-06-12 LAB — GLUCOSE, CAPILLARY
GLUCOSE-CAPILLARY: 90 mg/dL (ref 65–99)
Glucose-Capillary: 116 mg/dL — ABNORMAL HIGH (ref 65–99)
Glucose-Capillary: 113 mg/dL — ABNORMAL HIGH (ref 65–99)

## 2016-06-12 MED ORDER — MORPHINE SULFATE (PF) 2 MG/ML IV SOLN
2.0000 mg | INTRAVENOUS | Status: DC | PRN
  Administered 2016-06-12 – 2016-06-13 (×2): 2 mg via INTRAVENOUS
  Filled 2016-06-12 (×2): qty 1

## 2016-06-12 MED ORDER — AMLODIPINE BESYLATE 5 MG PO TABS
5.0000 mg | ORAL_TABLET | Freq: Every day | ORAL | Status: DC
  Administered 2016-06-13: 5 mg via ORAL
  Filled 2016-06-12 (×2): qty 1

## 2016-06-12 MED ORDER — HYDROCODONE-ACETAMINOPHEN 5-325 MG PO TABS
1.0000 | ORAL_TABLET | Freq: Four times a day (QID) | ORAL | Status: DC
  Administered 2016-06-13: 1 via ORAL
  Filled 2016-06-12 (×2): qty 1

## 2016-06-12 MED ORDER — METOPROLOL SUCCINATE ER 50 MG PO TB24
50.0000 mg | ORAL_TABLET | Freq: Every day | ORAL | Status: DC
  Administered 2016-06-13: 50 mg via ORAL
  Filled 2016-06-12 (×2): qty 1

## 2016-06-12 MED ORDER — SENNOSIDES-DOCUSATE SODIUM 8.6-50 MG PO TABS: 1.0000 | ORAL_TABLET | Freq: Every day | ORAL | Status: DC

## 2016-06-12 NOTE — Progress Notes (Signed)
Pt does her own blood sugar check every 4 hours with NT using her own paraphernalia. CBG's recorded as follows: 91, 103 and 112 as the latest. Nursing will continue to monitor.

## 2016-06-12 NOTE — Progress Notes (Signed)
PROGRESS NOTE    Jessica ParkJohnetta Robertson  ZOX:[id number] DOB: 04/29/1965 DOA: 06/09/2016 PCP: [name], [name], NP   Brief Narrative:  (Start on day 1 of progress note - keep it brief and live) Abdominal pain.  HPI: Jessica Robertson is a 51 y.o. female with history of diabetes mellitus type 2, hypertension presented to the ER because of abdominal pain. Patient has been experiencing epigastric pain with nausea for the last 2 days. Denies any diarrhea. Denies any fever or chills. In the ER labs revealed elevated lipase with CT scan showing pancreatic inflammation. Patient is being admitted for further management of acute pancreatitis. Patient denies drinking alcohol. Has had cholecystectomy last year. CT scan does not show any findings for any obstructing gallstones. LFTs are normal.   Assessment & Plan:   Principal Problem:   Acute pancreatitis Active Problems:   Hypertension   Controlled type 2 diabetes mellitus with hyperglycemia (HCC)  Acute pancreatitis - improving, ?etiology. No alcohol abuse history. CT scan does not show any obstructing gallstones. LFTs are normal. Trig- 124.  Pt was on lisinopril.  - Advance diet to full liquid, tolerating  - Reduced IV fluid to 75 mL an hour - Start Hydrocodone acetaminophen 5-325 every 6 hourly scheduled - Senokot - Morphine IV 2 mg every 4 hourly when necessary - Hold lisinopril on admission and on discharge - Appreciate GI evaluation, follow-up ENA and IgG4, repeat MRCP in 6-8 weeks after resolution of symptoms as outpatient to exclude neoplastic lesion, follow-up with her GI in [place] on discharge. ?lisinopril as etiology. - Advance diet tomorrow, Likely discharge tomorrow.  Hypertension - resume home Norvasc 5 and metoprolol 50mg  daily. - Hold lisinopril  Diabetes mellitus type 2 - will keep patient on sliding scale coverage.  DVT prophylaxis: SCDs. Code Status: Full code.  Family Communication: Discussed with patient.  Disposition Plan:  Home.  Admission status: Observation.   Consultants:   GI  Antimicrobials:  None  Subjective: Improving abdominal pain. Never had nausea vomiting. No increased abdominal pain with by mouth intake. Last bowel movement 2 days ago  Objective: Vitals:   06/11/16 1414 06/11/16 2001 06/12/16 0608 06/12/16 1346  BP: (!) 162/85 (!) 145/78 138/63 (!) 156/69  Pulse: 89 71 74 73  Resp: (!) 185 16 16 16   Temp: 99.3 F (37.4 C) 98.2 F (36.8 C) 98.3 F (36.8 C) 98.7 F (37.1 C)  TempSrc: Oral Oral Oral Oral  SpO2: 98% 95% 93% 96%  Weight:      Height:        Intake/Output Summary (Last 24 hours) at 06/12/16 1815 Last data filed at 06/12/16 1739  Gross per 24 hour  Intake             2030 ml  Output                0 ml  Net             2030 ml   Filed Weights   06/10/16 0000  Weight: 103 kg (227 lb 1.2 oz)    Examination:  General exam: Appears calm and comfortable  Respiratory system: Clear to auscultation. Respiratory effort normal. Cardiovascular system: S1 & S2 heard, RRR. No JVD, murmurs, rubs, gallops or clicks. No pedal edema. Gastrointestinal system: Abdomen is nondistended, soft and Minimal tenderness. Reduced bowel sounds  Central nervous system: Alert and oriented. No focal neurological deficits. Extremities: Symmetric 5 x 5 power. Skin: No rashes, lesions or ulcers Psychiatry: Judgement and insight  appear normal. Mood & affect appropriate.   Data Reviewed: I have personally reviewed following labs and imaging studies  CBC:  Recent Labs Lab 06/09/16 1645 06/10/16 0007 06/12/16 0530  WBC 5.6 6.6 3.9*  NEUTROABS 3.6  --   --   HGB 12.2 11.1* 11.2*  HCT 37.4 35.8* 35.3*  MCV 86.6 88.0 86.7  PLT 238 225 223   Basic Metabolic Panel:  Recent Labs Lab 06/09/16 1645 06/10/16 0007 06/11/16 0522 06/12/16 0530  NA 137 139 139 140  K 3.5 4.0 4.0 3.8  CL 105 106 108 107  CO2 25 27 26 25   GLUCOSE 169* 109* 92 102*  BUN 16 12 8  5*  CREATININE 0.89  0.89 0.79 0.71  CALCIUM 10.4* 10.0 9.8 9.9   Liver Function Tests:  Recent Labs Lab 06/09/16 1645 06/10/16 0007 06/11/16 0522 06/12/16 0530  AST 21 32 16 17  ALT 25 29 20 19   ALKPHOS 85 79 77 78  BILITOT 0.4 0.5 0.9 0.5  PROT 7.5 6.9 6.7 7.1  ALBUMIN 4.0 3.6 3.3* 3.5    Recent Labs Lab 06/09/16 1645 06/10/16 0947 06/11/16 0522  LIPASE 711* 363* 165*   Cardiac Enzymes:  Recent Labs Lab 06/10/16 0007  TROPONINI <0.03   CBG:  Recent Labs Lab 06/11/16 0818 06/11/16 1219 06/11/16 1619 06/12/16 1036 06/12/16 1654  GLUCAP 141* 84 114* 90 116*   Lipid Profile:  Recent Labs  06/10/16 0007  TRIG 124   Radiology Studies: No results found.  Scheduled Meds: . HYDROcodone-acetaminophen  1 tablet Oral Q6H  . insulin aspart  0-9 Units Subcutaneous Q4H  . senna-docusate  1 tablet Oral QHS   Continuous Infusions: . sodium chloride 75 mL/hr at 06/12/16 1044     LOS: 2 days   Onnie Boer, MD [facility] Pager [phone]  If 7PM-7AM, please contact night-coverage [email or web address] Password [email or web address] 06/12/2016, 6:15 PM

## 2016-06-13 DIAGNOSIS — I10 Essential (primary) hypertension: Secondary | ICD-10-CM | POA: Diagnosis not present

## 2016-06-13 DIAGNOSIS — E1165 Type 2 diabetes mellitus with hyperglycemia: Secondary | ICD-10-CM | POA: Diagnosis not present

## 2016-06-13 DIAGNOSIS — K8500 Idiopathic acute pancreatitis without necrosis or infection: Secondary | ICD-10-CM | POA: Diagnosis not present

## 2016-06-13 LAB — GLUCOSE, CAPILLARY
GLUCOSE-CAPILLARY: 103 mg/dL — AB (ref 65–99)
GLUCOSE-CAPILLARY: 166 mg/dL — AB (ref 65–99)
Glucose-Capillary: 118 mg/dL — ABNORMAL HIGH (ref 65–99)

## 2016-06-13 LAB — ANTINUCLEAR ANTIBODIES, IFA: ANTINUCLEAR ANTIBODIES, IFA: NEGATIVE

## 2016-06-13 MED ORDER — HYDROCODONE-ACETAMINOPHEN 5-325 MG PO TABS: 1.0000 | ORAL_TABLET | Freq: Four times a day (QID) | ORAL | 0 refills | Status: DC | PRN

## 2016-06-13 MED ORDER — ONDANSETRON 4 MG PO TBDP: 4.0000 mg | ORAL_TABLET | Freq: Three times a day (TID) | ORAL | 0 refills | Status: DC | PRN

## 2016-06-13 MED ORDER — ONDANSETRON 4 MG PO TBDP: 4.0000 mg | ORAL_TABLET | Freq: Four times a day (QID) | ORAL | 0 refills | Status: DC | PRN

## 2016-06-13 MED ORDER — AMLODIPINE BESYLATE 10 MG PO TABS: 10.0000 mg | ORAL_TABLET | Freq: Every day | ORAL | 3 refills | Status: AC

## 2016-06-13 MED ORDER — AMLODIPINE BESYLATE 10 MG PO TABS: 10.0000 mg | ORAL_TABLET | Freq: Every day | ORAL | Status: DC

## 2016-06-13 NOTE — Care Management Note (Addendum)
Case Management Note  Patient Details  Name: Jessica ParkJohnetta Robertson MRN: [id number] Date of Birth: 02/02/1966  Subjective/Objective:    Adm w pancreatitis/abd pain                Action/Plan: lives at home w husband, pcp dr Gracelyn Nursekilby   Expected Discharge Date:  06/13/16               Expected Discharge Plan:  Home/Self Care  In-House Referral:     Discharge planning Services     Post Acute Care Choice:    Choice offered to:     DME Arranged:    DME Agency:     HH Arranged:    HH Agency:     Status of Service:  Completed, signed off  If discussed at [employer] Length of Stay Meetings, dates discussed:    Additional Comments:poss dc 2/19 w no dc needs anticipated. Did give pt match letter. She has pcp and has appt shed next week. She is aware only certain drug stores participate in match program and she has list. Understands can only help w match 1 time per year and 30day supply and no pain meds covered under plan.  Hanley Haysowell, [facility] T, RN 06/13/2016, 8:44 AM

## 2016-06-13 NOTE — Discharge Summary (Signed)
Physician Discharge Summary   Patient ID: Jessica Robertson MRN: [id number] DOB/AGE: 51/06/1965 51 y.o.  Admit date: 06/09/2016 Discharge date: 06/13/2016  Primary Care Physician:  [name], [name], NP  Discharge Diagnoses:    . Acute pancreatitis . Hypertension . Controlled type 2 diabetes mellitus with hyperglycemia (HCC)   Consults: Gastroenterology  Recommendations for Outpatient Follow-up:  1. Lisinopril discontinued 2. Please repeat CBC/BMET at next visit 3. Patient was given prescription for Norco, 30 tablets with no refills for acute pancreatitis pain recovery period post hospitalization.   DIET: Low-fat, modified diet    Allergies:  No Known Allergies   DISCHARGE MEDICATIONS: Current Discharge Medication List    START taking these medications   Details  HYDROcodone-acetaminophen (NORCO/VICODIN) 5-325 MG tablet Take 1 tablet by mouth every 6 (six) hours as needed for moderate pain. Qty: 30 tablet, Refills: 0    ondansetron (ZOFRAN ODT) 4 MG disintegrating tablet Take 1 tablet (4 mg total) by mouth every 6 (six) hours as needed for nausea or vomiting. Qty: 30 tablet, Refills: 0      CONTINUE these medications which have CHANGED   Details  amLODipine (NORVASC) 10 MG tablet Take 1 tablet (10 mg total) by mouth daily. Qty: 30 tablet, Refills: 3      CONTINUE these medications which have NOT CHANGED   Details  atorvastatin (LIPITOR) 20 MG tablet Take 20 mg by mouth daily.    metFORMIN (GLUCOPHAGE) 500 MG tablet Take 500 mg by mouth daily.     metoprolol succinate (TOPROL-XL) 50 MG 24 hr tablet Take 50 mg by mouth daily. Take with or immediately following a meal.      STOP taking these medications     lisinopril (PRINIVIL,ZESTRIL) 40 MG tablet          Brief H and P: For complete details please refer to admission H and P, but in brief Jessica Robertson a 50 y.o.femalewith history of diabetes mellitus type 2, hypertension presented to the ER because of  abdominal pain. Patient has been experiencing epigastric pain with nausea for the last 2 days. Denies any diarrhea. Denies any fever or chills. In the ER labs revealed elevated lipase with CT scan showing pancreatic inflammation. Patient is being admitted for further management of acute pancreatitis. Patient denies drinking alcohol. Has had cholecystectomy last year. CT scan does not show any findings for any obstructing gallstones. LFTs are normal.  Hospital Course:  Acute idiopathic pancreatitis - improving - Patient was admitted with acute pancreatitis who is already status post cholecystectomy for cholelithiasis. Patient did not have evidence of choledocholithiasis on ultrasound or intraoperative angiogram in August 2017. - CT abdomen and pelvis did not show any obstructive gallstones or intrahepatic or CBD dilatation. - Patient denied any alcohol abuse history triglycerides 124 - GI was consulted. Per GI autoimmune pancreatitis really presents acutely, more commonly seen with incidental changes on imaging.  ANA, IgG4 in process to exclude - Per GI, repeat MRCP in 6-8 weeks after resolution of symptoms as outpatient to exclude neoplastic lesion - [place] Dr Clovis Puhristopher Groh - Lisinopril was held due to rare possibility of side effect of pancreatitis   Hypertension- - resuming metoprolol, increase Norvasc 10 mg daily  - Lisinopril was held  due to rare possibility of pancreatitis   Diabetes mellitus type 2 - Patient was placed on sliding scale insulin while inpatient    Day of Discharge BP (!) 158/88 (BP Location: Left Arm)  Pulse 86   Temp 98.2 F (36.8 C) (Oral)   Resp 16   Ht 5\' 4"  (1.626 m)   Wt 103 kg (227 lb 1.2 oz)   SpO2 95%   BMI 38.98 kg/m   Physical Exam: General: Alert and awake oriented x3 not in any acute distress. HEENT: anicteric sclera, pupils reactive to light and accommodation CVS: S1-S2 clear no murmur rubs or  gallops Chest: clear to auscultation bilaterally, no wheezing rales or rhonchi Abdomen: soft nontender, nondistended, normal bowel sounds Extremities: no cyanosis, clubbing or edema noted bilaterally Neuro: Cranial nerves II-XII intact, no focal neurological deficits   The results of significant diagnostics from this hospitalization (including imaging, microbiology, ancillary and laboratory) are listed below for reference.    LAB RESULTS: Basic Metabolic Panel:  Recent Labs Lab 06/11/16 0522 06/12/16 0530  NA 139 140  K 4.0 3.8  CL 108 107  CO2 26 25  GLUCOSE 92 102*  BUN 8 5*  CREATININE 0.79 0.71  CALCIUM 9.8 9.9   Liver Function Tests:  Recent Labs Lab 06/11/16 0522 06/12/16 0530  AST 16 17  ALT 20 19  ALKPHOS 77 78  BILITOT 0.9 0.5  PROT 6.7 7.1  ALBUMIN 3.3* 3.5    Recent Labs Lab 06/10/16 0947 06/11/16 0522  LIPASE 363* 165*   No results for input(s): AMMONIA in the last 168 hours. CBC:  Recent Labs Lab 06/09/16 1645 06/10/16 0007 06/12/16 0530  WBC 5.6 6.6 3.9*  NEUTROABS 3.6  --   --   HGB 12.2 11.1* 11.2*  HCT 37.4 35.8* 35.3*  MCV 86.6 88.0 86.7  PLT 238 225 223   Cardiac Enzymes:  Recent Labs Lab 06/10/16 0007  TROPONINI <0.03   BNP: Invalid input(s): POCBNP CBG:  Recent Labs Lab 06/13/16 0239 06/13/16 0628  GLUCAP 103* 118*    Significant Diagnostic Studies:  Ct Abdomen Pelvis W Contrast  Result Date: 06/09/2016 CLINICAL DATA:  51 y/o  F; epigastric pain and elevated lipase. EXAM: CT ABDOMEN AND PELVIS WITH CONTRAST TECHNIQUE: Multidetector CT imaging of the abdomen and pelvis was performed using the standard protocol following bolus administration of intravenous contrast. CONTRAST:  ISOVUE-300 IOPAMIDOL (ISOVUE-300) INJECTION 61% COMPARISON:  None. FINDINGS: Lower chest: No acute abnormality. Hepatobiliary: 11 mm fluid attenuating focus in segment 8 of the liver is likely a cyst. No other focal liver abnormality.  Status post cholecystectomy. No intra or extrahepatic biliary ductal dilatation. Pancreas: There is fat stranding surrounding the pancreas from head to tail probably representing acute pancreatitis. No main duct dilatation. No hypoenhancement or acute peripancreatic collection identified. Spleen: Normal in size without focal abnormality. Adrenals/Urinary Tract: Adrenal glands are unremarkable. Kidneys are normal, without renal calculi, focal lesion, or hydronephrosis. Bladder is unremarkable. Stomach/Bowel: Stomach is within normal limits. Appendix appears normal. No evidence of bowel wall thickening, distention, or inflammatory changes. Vascular/Lymphatic: Aortic atherosclerosis. No enlarged abdominal or pelvic lymph nodes. Reproductive: Uterus and bilateral adnexa are unremarkable. Other: No abdominal wall hernia or abnormality. No abdominopelvic ascites. Musculoskeletal: No acute or significant osseous findings. IMPRESSION: Diffuse edema surrounding the pancreas from head to tail compatible with acute pancreatitis. No main duct dilatation, evidence for necrosis, or acute peripancreatic collection identified. Electronically Signed   By: Mitzi Hansen M.D.   On: 06/09/2016 19:58    2D ECHO:   Disposition and Follow-up: Discharge Instructions    Diet Carb Modified    Complete by:  As directed    Discharge instructions  Complete by:  As directed    Diet: Low fat or full liquid diet as tolerated. Avoid fried food.   Increase activity slowly    Complete by:  As directed        DISPOSITION: home   DISCHARGE FOLLOW-UP Follow-up Information    GROH, CHRISTOPHER, PA-C. Schedule an appointment as soon as possible for a visit in 10 day(s).   Specialty:  Gastroenterology Why:  for hospital follow-up  Contact information: [address] [place] [address] [phone]        Redmond School, NP. Schedule an appointment as soon as possible for a visit in 2 week(s).    Contact information: [address] [place] [address] [phone]            Time spent on Discharge:   Signed:   RAI,RIPUDEEP M.D. [facility] 06/13/2016, 9:48 AM Pager: [phone]

## 2016-06-13 NOTE — Progress Notes (Signed)
Discharge instructions (including medications) discussed with and copy provided to patient/caregiver 

## 2016-06-14 LAB — IGG 4: IGG 4: 35 mg/dL (ref 2–96)

## 2016-11-26 ENCOUNTER — Encounter (HOSPITAL_BASED_OUTPATIENT_CLINIC_OR_DEPARTMENT_OTHER): Payer: Self-pay

## 2016-11-26 ENCOUNTER — Emergency Department (HOSPITAL_BASED_OUTPATIENT_CLINIC_OR_DEPARTMENT_OTHER)
Admission: EM | Admit: 2016-11-26 | Discharge: 2016-11-26 | Disposition: A | Discharge status: Home / Self Care | Payer: 59 | Attending: Emergency Medicine | Admitting: Emergency Medicine

## 2016-11-26 DIAGNOSIS — I10 Essential (primary) hypertension: Secondary | ICD-10-CM | POA: Insufficient documentation

## 2016-11-26 DIAGNOSIS — K047 Periapical abscess without sinus: Secondary | ICD-10-CM | POA: Insufficient documentation

## 2016-11-26 DIAGNOSIS — E119 Type 2 diabetes mellitus without complications: Secondary | ICD-10-CM | POA: Insufficient documentation

## 2016-11-26 DIAGNOSIS — Z79899 Other long term (current) drug therapy: Secondary | ICD-10-CM | POA: Insufficient documentation

## 2016-11-26 DIAGNOSIS — Z7984 Long term (current) use of oral hypoglycemic drugs: Secondary | ICD-10-CM | POA: Insufficient documentation

## 2016-11-26 LAB — CBG MONITORING, ED: GLUCOSE-CAPILLARY: 136 mg/dL — AB (ref 65–99)

## 2016-11-26 MED ORDER — CLINDAMYCIN HCL 150 MG PO CAPS: 300.0000 mg | ORAL_CAPSULE | Freq: Three times a day (TID) | ORAL | 0 refills | Status: AC

## 2016-11-26 MED ORDER — CLINDAMYCIN HCL 150 MG PO CAPS
300.0000 mg | ORAL_CAPSULE | Freq: Once | ORAL | Status: AC
  Administered 2016-11-26: 300 mg via ORAL
  Filled 2016-11-26: qty 2

## 2016-11-26 NOTE — ED Notes (Signed)
Pt has swollen cheek on left side of face. Tooth on upper left side looks infected/rotten. Pt denies pain when eating or drinking.

## 2016-11-26 NOTE — ED Triage Notes (Signed)
Left sided facial swelling. Started yesterday 11/25/2016 in the morning and gradually got larger.

## 2016-11-26 NOTE — ED Provider Notes (Signed)
[facility] Note   CSN: [id number] Arrival date & time: 11/26/16  0711     History   Chief Complaint Chief Complaint  Patient presents with  . Facial Swelling    HPI Jessica Robertson is a 51 y.o. female.  HPI Patient presents to the emergency room for evaluation of left-sided facial swelling that started yesterday.  Patient thinks it may be related to her tooth. She has a cracked tooth in the left upper canine. That has been there for quite a while. She no she needs to have it pulled. Yesterday she started noticing the facial swelling and tenderness. This morning its worse. She denies any fever. She denies any vomiting or diarrhea. She denies any difficulty swallowing. No difficulty breathing. Past Medical History:  Diagnosis Date  . Diabetes mellitus without complication (HCC)   . Gout   . Hyperlipemia   . Hypertension   . Plantar fasciitis     Patient Active Problem List   Diagnosis Date Noted  . Controlled type 2 diabetes mellitus with hyperglycemia (HCC) 06/10/2016  . Acute pancreatitis 06/09/2016  . Hypertension   . Hyperlipemia   . Acute Cholecystitis s/p lap cholecystectomy 12/10/2015 12/10/2015    Past Surgical History:  Procedure Laterality Date  . ablation    . CHOLECYSTECTOMY N/A 12/11/2015   Procedure: LAPAROSCOPIC CHOLECYSTECTOMY WITH INTRAOPERATIVE CHOLANGIOGRAM;  Surgeon: Luretha MurphyMatthew Martin, MD;  Location: [facility];  Service: General;  Laterality: N/A;  . LYMPH NODE DISSECTION    . TUBAL LIGATION      OB History    No data available       Home Medications    Prior to Admission medications   Medication Sig Start Date End Date Taking? Authorizing Provider  amLODipine (NORVASC) 10 MG tablet Take 1 tablet (10 mg total) by mouth daily. 06/13/16  Yes Rai, Ripudeep K, MD  atorvastatin (LIPITOR) 20 MG tablet Take 20 mg by mouth daily.   Yes [provider]  metFORMIN (GLUCOPHAGE) 500 MG tablet Take 500 mg by mouth daily.    Yes  [provider]  metoprolol succinate (TOPROL-XL) 50 MG 24 hr tablet Take 50 mg by mouth daily. Take with or immediately following a meal.   Yes [provider]  clindamycin (CLEOCIN) 150 MG capsule Take 2 capsules (300 mg total) by mouth 3 (three) times daily. 11/26/16 12/03/16  Linwood DibblesKnapp, Sonia Bromell, MD  HYDROcodone-acetaminophen (NORCO/VICODIN) 5-325 MG tablet Take 1 tablet by mouth every 6 (six) hours as needed for moderate pain. 06/13/16   Rai, Ripudeep K, MD  ondansetron (ZOFRAN ODT) 4 MG disintegrating tablet Take 1 tablet (4 mg total) by mouth every 6 (six) hours as needed for nausea or vomiting. 06/13/16   Cathren Harshai, Ripudeep K, MD    Family History Family History  Problem Relation Age of Onset  . Hypertension Other     Social History Social History  Substance Use Topics  . Smoking status: Never Smoker  . Smokeless tobacco: Never Used  . Alcohol use No     Allergies   Patient has no known allergies.   Review of Systems Review of Systems  All other systems reviewed and are negative.    Physical Exam Updated Vital Signs BP (!) 141/84 (BP Location: Right Arm)   Pulse 75   Temp 98.8 F (37.1 C) (Oral)   Resp 20   Ht 1.676 m (5\' 6" )   Wt 103 kg (227 lb)   SpO2 96%   BMI 36.64 kg/m  Physical Exam  Constitutional: She appears well-developed and well-nourished. No distress.  HENT:  Head: Normocephalic and atraumatic.    Right Ear: External ear normal.  Left Ear: External ear normal.  Mouth/Throat:    Eyes: Pupils are equal, round, and reactive to light. Conjunctivae and EOM are normal. Right eye exhibits no discharge. Left eye exhibits no discharge. No scleral icterus.  Neck: Neck supple. No tracheal deviation present.  Cardiovascular: Normal rate, regular rhythm and normal heart sounds.   Pulmonary/Chest: Effort normal and breath sounds normal. No stridor. No respiratory distress. She has no wheezes.  Abdominal: She exhibits no distension.  Musculoskeletal:  She exhibits no edema.  Neurological: She is alert. Cranial nerve deficit: no gross deficits.  Skin: Skin is warm and dry. No rash noted.  Psychiatric: She has a normal mood and affect.  Nursing note and vitals reviewed.    ED Treatments / Results  Labs (all labs ordered are listed, but only abnormal results are displayed) Labs Reviewed  CBG MONITORING, ED - Abnormal; Notable for the following:       Result Value   Glucose-Capillary 136 (*)    All other components within normal limits   Procedures Procedures (including critical care time)  Medications Ordered in ED Medications  clindamycin (CLEOCIN) capsule 300 mg (300 mg Oral Given 11/26/16 0737)     Initial Impression / Assessment and Plan / ED Course  I have reviewed the triage vital signs and the nursing notes.  Pertinent labs & imaging results that were available during my care of the patient were reviewed by me and considered in my medical decision making (see chart for details).   patient appears to have a decayed tooth most likely resulting in a dental abscess.  Patient is not having any difficulty breathing or swallowing. She is not having any fevers. I will start the patient on course of antibiotics. I stressed the importance of following up with a dentist.  Final Clinical Impressions(s) / ED Diagnoses   Final diagnoses:  Dental abscess    New Prescriptions New Prescriptions   CLINDAMYCIN (CLEOCIN) 150 MG CAPSULE    Take 2 capsules (300 mg total) by mouth 3 (three) times daily.     Linwood DibblesKnapp, Daymein Nunnery, MD 11/26/16 [phone]

## 2016-11-26 NOTE — Discharge Instructions (Signed)
Take the antibiotics until they are finished, follow up with a dentist as soon as possible, return for fever, worsening symptoms

## 2017-03-27 ENCOUNTER — Encounter (HOSPITAL_BASED_OUTPATIENT_CLINIC_OR_DEPARTMENT_OTHER): Payer: Self-pay | Admitting: *Deleted

## 2017-03-27 ENCOUNTER — Other Ambulatory Visit: Payer: Self-pay

## 2017-03-27 DIAGNOSIS — I10 Essential (primary) hypertension: Secondary | ICD-10-CM | POA: Insufficient documentation

## 2017-03-27 DIAGNOSIS — M25461 Effusion, right knee: Secondary | ICD-10-CM | POA: Insufficient documentation

## 2017-03-27 DIAGNOSIS — Z7984 Long term (current) use of oral hypoglycemic drugs: Secondary | ICD-10-CM | POA: Insufficient documentation

## 2017-03-27 DIAGNOSIS — E119 Type 2 diabetes mellitus without complications: Secondary | ICD-10-CM | POA: Insufficient documentation

## 2017-03-27 DIAGNOSIS — M7122 Synovial cyst of popliteal space [Baker], left knee: Secondary | ICD-10-CM | POA: Insufficient documentation

## 2017-03-27 NOTE — ED Triage Notes (Signed)
Right leg pain with swelling to upper leg x 3 days. Denies known injury. Ambulatory.

## 2017-03-28 ENCOUNTER — Emergency Department (HOSPITAL_BASED_OUTPATIENT_CLINIC_OR_DEPARTMENT_OTHER): Payer: Self-pay

## 2017-03-28 ENCOUNTER — Emergency Department (HOSPITAL_BASED_OUTPATIENT_CLINIC_OR_DEPARTMENT_OTHER)
Admission: EM | Admit: 2017-03-28 | Discharge: 2017-03-28 | Disposition: A | Discharge status: Home / Self Care | Payer: Self-pay | Attending: Emergency Medicine | Admitting: Emergency Medicine

## 2017-03-28 DIAGNOSIS — M7122 Synovial cyst of popliteal space [Baker], left knee: Secondary | ICD-10-CM

## 2017-03-28 DIAGNOSIS — M25461 Effusion, right knee: Secondary | ICD-10-CM

## 2017-03-28 MED ORDER — PREDNISONE 10 MG PO TABS: 20.0000 mg | ORAL_TABLET | Freq: Two times a day (BID) | ORAL | 0 refills | Status: DC

## 2017-03-28 MED ORDER — HYDROCODONE-ACETAMINOPHEN 5-325 MG PO TABS: 1.0000 | ORAL_TABLET | Freq: Four times a day (QID) | ORAL | 0 refills | Status: DC | PRN

## 2017-03-28 NOTE — ED Provider Notes (Signed)
[facility] EMERGENCY DEPARTMENT Provider Note   CSN: [id number] Arrival date & time: 03/27/17  2321     History   Chief Complaint Chief Complaint  Patient presents with  . Leg Swelling    HPI Jessica Robertson is a 51 y.o. female.  Patient is a 51 year old female with past medical history of diabetes, gout, hypertension presenting for evaluation of right leg pain.  This started 4 days ago in the absence of any injury or trauma.  She reports pain in the back of her knee, front of her knee, and calf.  She is concerned about the possibility of a blood clot, but is never had 1 of these before.  She denies any chest pain or shortness of breath.  She denies any fevers or chills.  Her pain is worse with ambulation and somewhat relieved with rest.   The history is provided by the patient.    Past Medical History:  Diagnosis Date  . Diabetes mellitus without complication (HCC)   . Gout   . Hyperlipemia   . Hypertension   . Plantar fasciitis     Patient Active Problem List   Diagnosis Date Noted  . Controlled type 2 diabetes mellitus with hyperglycemia (HCC) 06/10/2016  . Acute pancreatitis 06/09/2016  . Hypertension   . Hyperlipemia   . Acute Cholecystitis s/p lap cholecystectomy 12/10/2015 12/10/2015    Past Surgical History:  Procedure Laterality Date  . ablation    . CHOLECYSTECTOMY N/A 12/11/2015   Procedure: LAPAROSCOPIC CHOLECYSTECTOMY WITH INTRAOPERATIVE CHOLANGIOGRAM;  Surgeon: Luretha MurphyMatthew Martin, MD;  Location: [facility];  Service: General;  Laterality: N/A;  . LYMPH NODE DISSECTION    . TUBAL LIGATION      OB History    No data available       Home Medications    Prior to Admission medications   Medication Sig Start Date End Date Taking? Authorizing Provider  amLODipine (NORVASC) 10 MG tablet Take 1 tablet (10 mg total) by mouth daily. 06/13/16   Rai, Delene Ruffiniipudeep K, MD  atorvastatin (LIPITOR) 20 MG tablet Take 20 mg by mouth daily.    [provider]  HYDROcodone-acetaminophen (NORCO/VICODIN) 5-325 MG tablet Take 1 tablet by mouth every 6 (six) hours as needed for moderate pain. 06/13/16   Rai, Delene Ruffiniipudeep K, MD  metFORMIN (GLUCOPHAGE) 500 MG tablet Take 500 mg by mouth daily.     [provider]  metoprolol succinate (TOPROL-XL) 50 MG 24 hr tablet Take 50 mg by mouth daily. Take with or immediately following a meal.    [provider]  ondansetron (ZOFRAN ODT) 4 MG disintegrating tablet Take 1 tablet (4 mg total) by mouth every 6 (six) hours as needed for nausea or vomiting. 06/13/16   Cathren Harshai, Ripudeep K, MD    Family History Family History  Problem Relation Age of Onset  . Hypertension Other     Social History Social History   Tobacco Use  . Smoking status: Never Smoker  . Smokeless tobacco: Never Used  Substance Use Topics  . Alcohol use: No  . Drug use: No     Allergies   Patient has no known allergies.   Review of Systems Review of Systems  All other systems reviewed and are negative.    Physical Exam Updated Vital Signs BP (!) 176/88 (BP Location: Left Arm)   Pulse 74   Temp 98.2 F (36.8 C) (Oral)   Resp 18   Ht 5\' 4"  (1.626 m)   Wt  99.8 kg (220 lb)   SpO2 99%   BMI 37.76 kg/m   Physical Exam  Constitutional: She is oriented to person, place, and time. She appears well-developed and well-nourished. No distress.  HENT:  Head: Normocephalic and atraumatic.  Neck: Normal range of motion. Neck supple.  Cardiovascular: Normal rate and regular rhythm. Exam reveals no gallop and no friction rub.  No murmur heard. Pulmonary/Chest: Effort normal and breath sounds normal. No respiratory distress. She has no wheezes.  Abdominal: Soft. Bowel sounds are normal. She exhibits no distension. There is no tenderness.  Musculoskeletal: Normal range of motion.  Does appear to have a palpable effusion.  She has good range of motion with no crepitus.  There is no varus or valgus laxity and anterior and  posterior drawer tests are negative.  Homans sign is absent.  DP pulses, sensation, and motor are intact to the foot.  Neurological: She is alert and oriented to person, place, and time.  Skin: Skin is warm and dry. She is not diaphoretic.  Nursing note and vitals reviewed.    ED Treatments / Results  Labs (all labs ordered are listed, but only abnormal results are displayed) Labs Reviewed - No data to display  EKG  EKG Interpretation None       Radiology No results found.  Procedures Procedures (including critical care time)  Medications Ordered in ED Medications - No data to display   Initial Impression / Assessment and Plan / ED Course  I have reviewed the triage vital signs and the nursing notes.  Pertinent labs & imaging results that were available during my care of the patient were reviewed by me and considered in my medical decision making (see chart for details).  Patient presents with complaints of right leg pain.  She has a palpable effusion on exam and this is also visible on the ultrasound and x-ray.  I suspect she has some underlying DJD with effusion that is contributing to her pain.  She also appears to have a Baker's cyst on her ultrasound, however no DVT.  She will be treated with prednisone, pain medicine, and follow-up with primary doctor if not improving.  Final Clinical Impressions(s) / ED Diagnoses   Final diagnoses:  None    ED Discharge Orders    None       Geoffery Lyonselo, Devanie Galanti, MD 03/28/17 [phone]

## 2017-03-28 NOTE — Discharge Instructions (Signed)
Prednisone as prescribed.  Hydrocodone is prescribed as needed for pain.  Follow-up with your primary doctor if you are not improving in the next week, and return to the ER if symptoms significantly worsen or change.

## 2017-03-28 NOTE — ED Notes (Signed)
ED Provider at bedside. 

## 2017-03-28 NOTE — ED Notes (Signed)
Patient transported to X-ray 

## 2017-06-20 IMAGING — US US ABDOMEN COMPLETE
1 series · 14 of 25 positions shown · non-contrast
Comparison: None in PACs

CLINICAL DATA: Epigastric pain and nausea for the past 4 days.

EXAM:
ABDOMEN ULTRASOUND COMPLETE

[Series 1: us abdomen complete · 0.21mm/px · 14 of 74 slices shown]
[im 1/74]
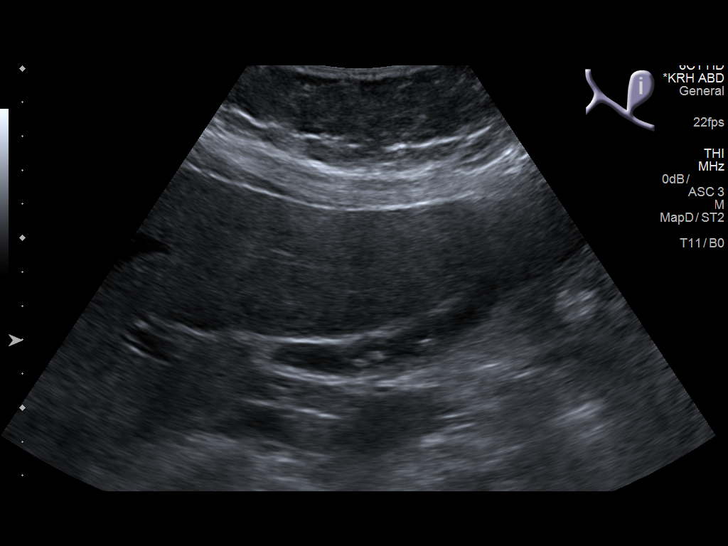
[im 7/74]
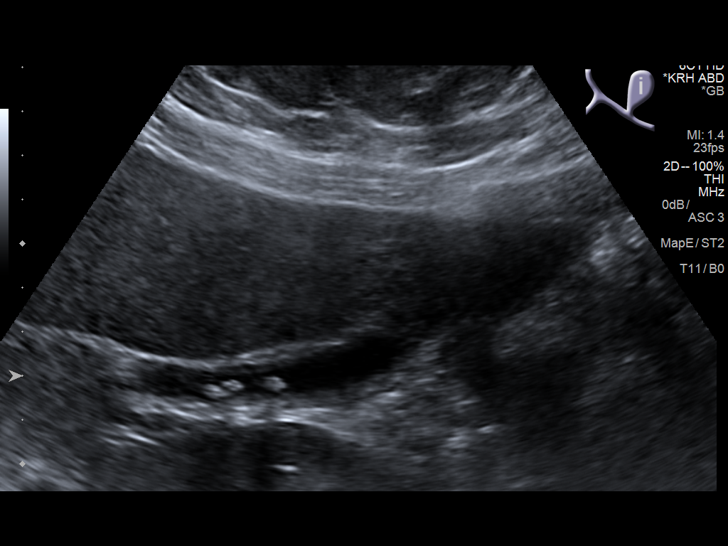
[im 13/74]
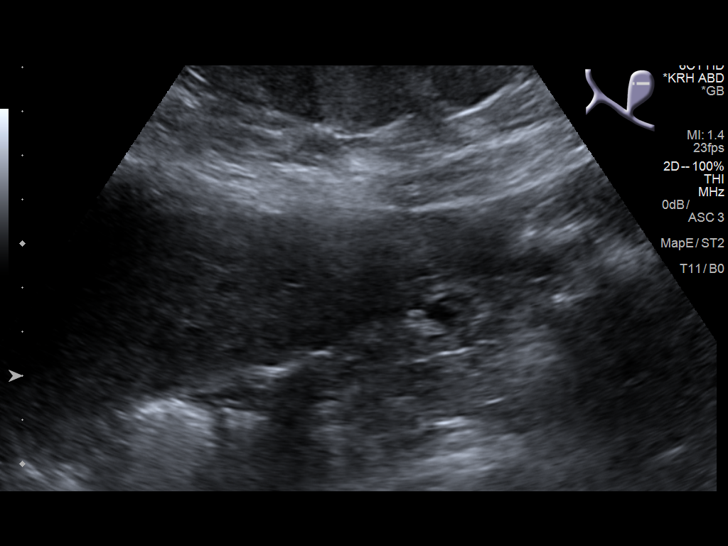
[im 19/74]
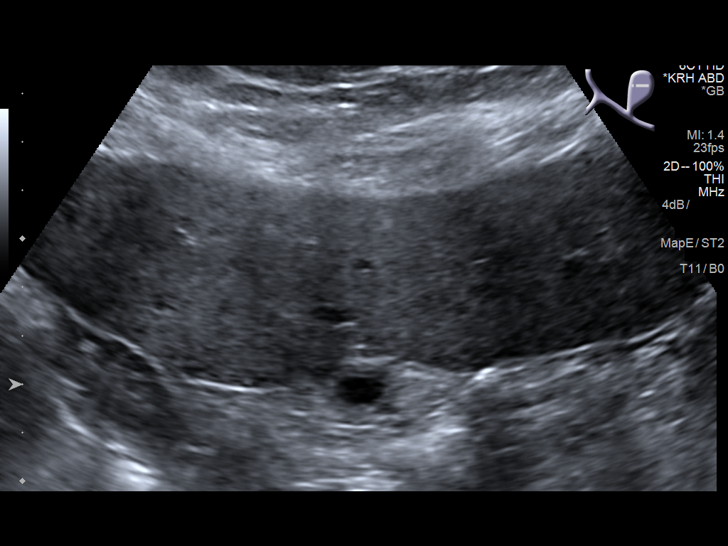
[im 25/74]
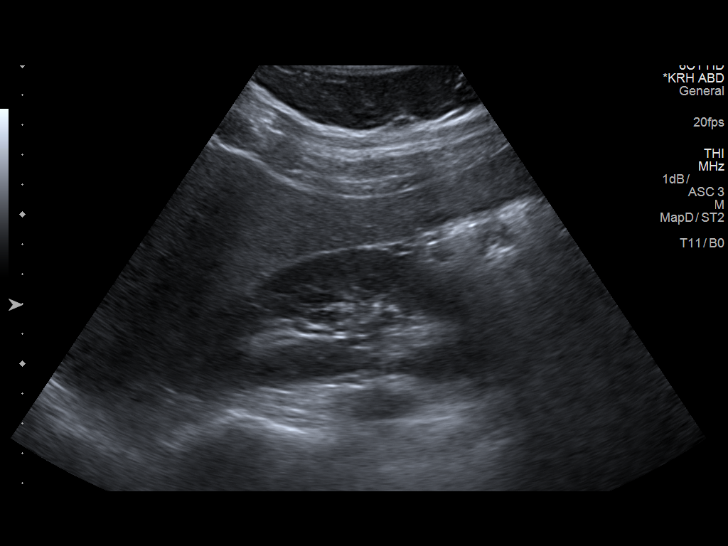
[im 28/74]
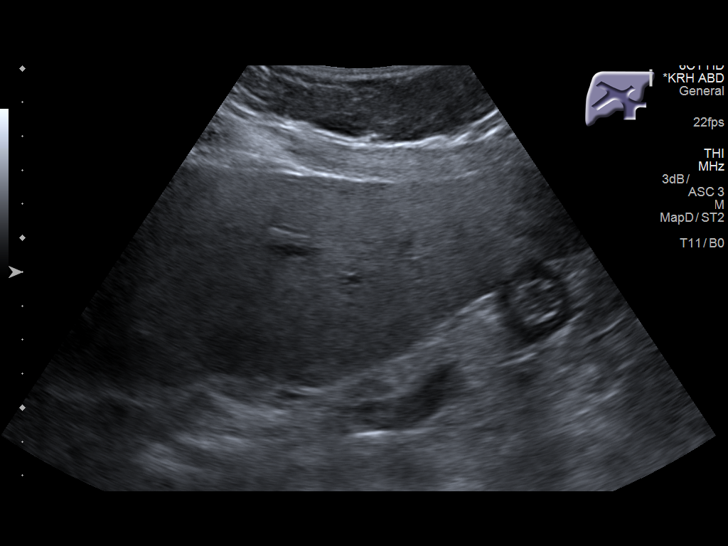
[im 34/74]
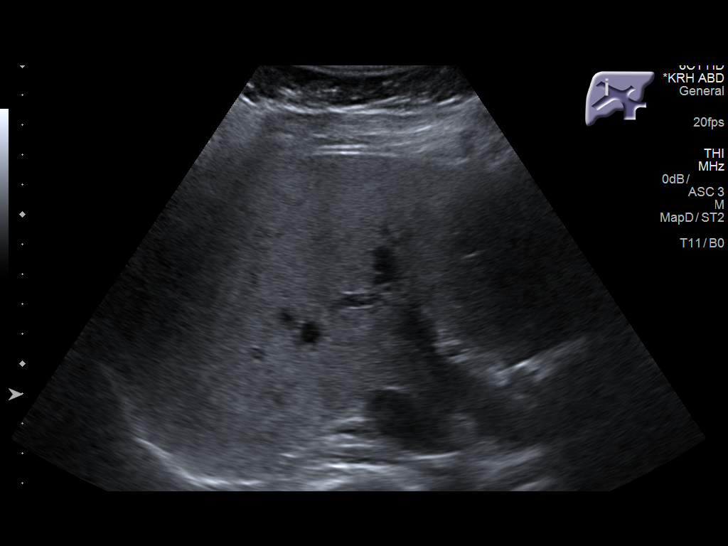
[im 40/74]
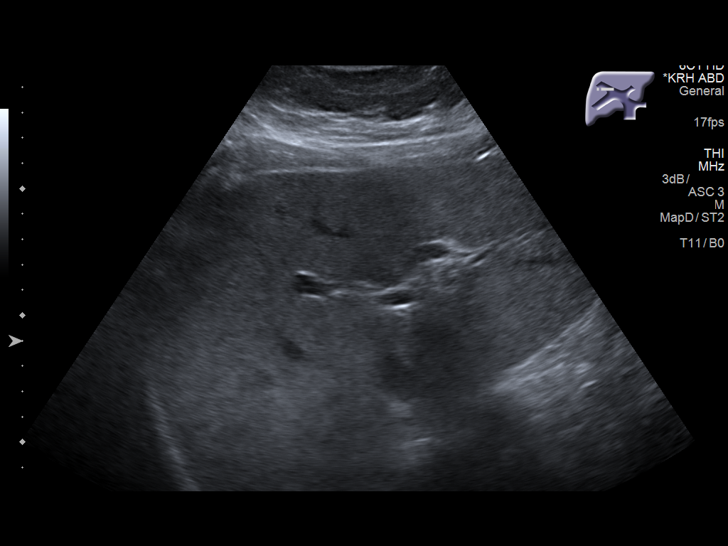
[im 46/74]
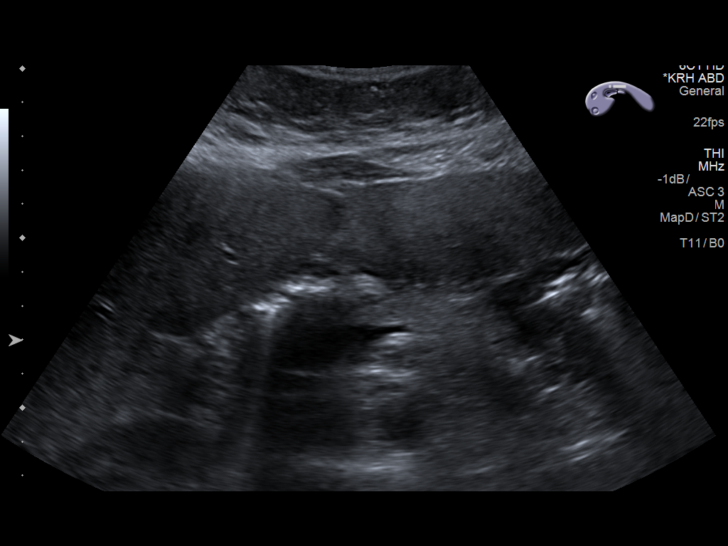
[im 49/74]
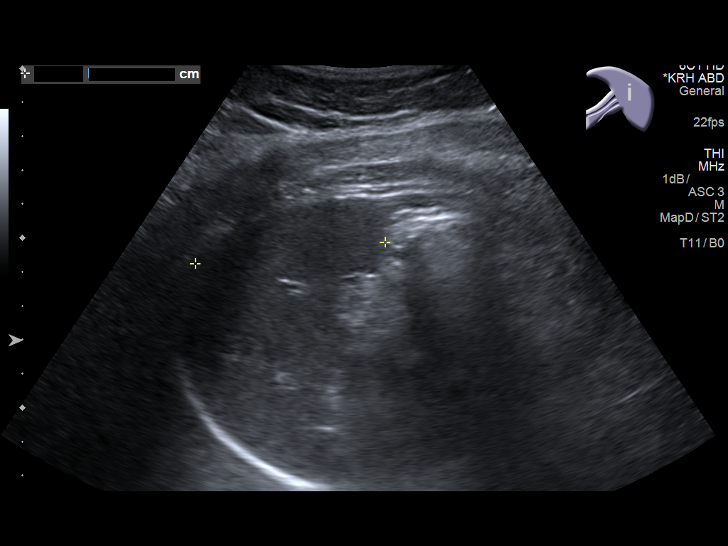
[im 55/74]
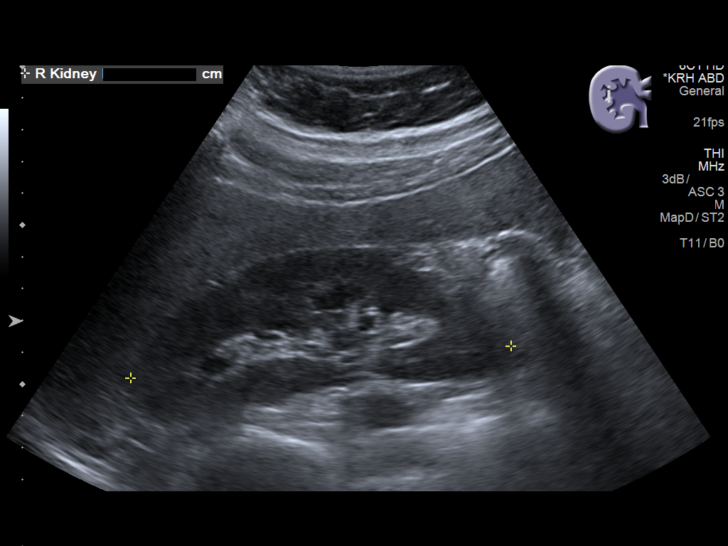
[im 61/74]
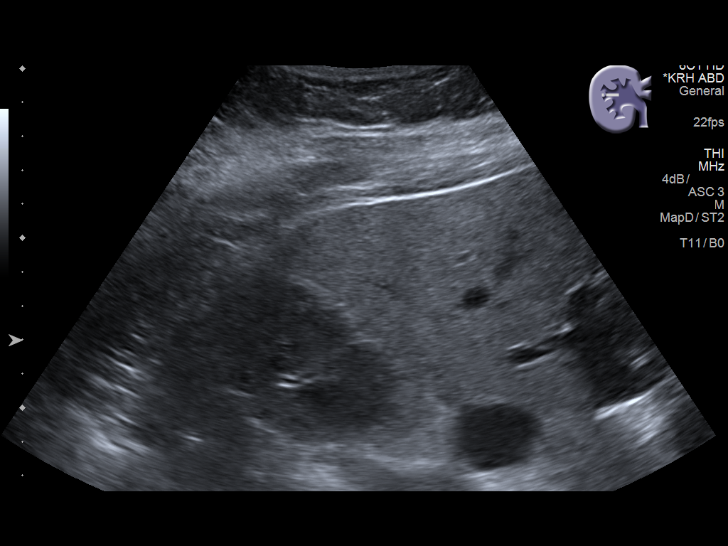
[im 67/74]
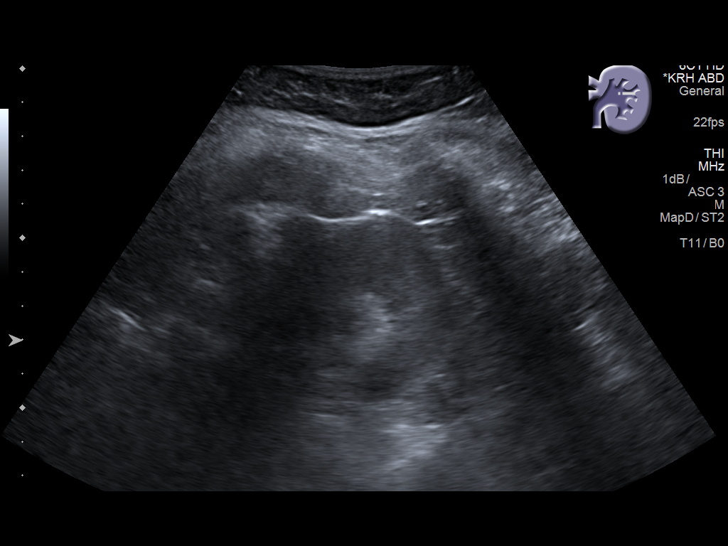
[im 74/74]
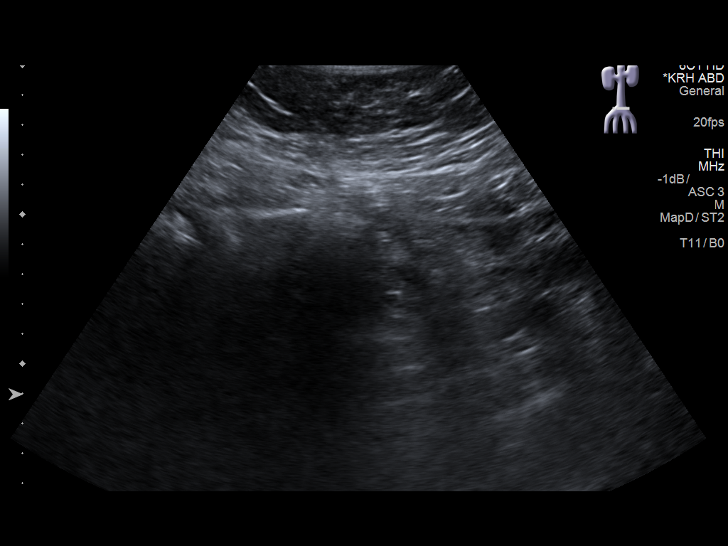

[14 of 25 positions shown; findings below may reference images not displayed]

FINDINGS: Gallbladder: The gallbladder is only partially distended. There are
multiple echogenic mobile shadowing stones. There is mild
gallbladder wall thickening to 4 mm. There is a positive sonographic
Murphy's sign.

Common bile duct: Diameter: 2 mm

Liver: The hepatic echotexture is mildly increased diffusely. There
is no focal mass nor ductal dilation.

IVC: Bowel gas limits evaluation of the inferior vena cava.

Pancreas: Bowel gas limits evaluation of the pancreatic head and
tail.

Spleen: Size and appearance within normal limits.

Right Kidney: Length: 12 cm. Echogenicity within normal limits. No
mass or hydronephrosis visualized.

Left Kidney: Length: 12.2 cm. Echogenicity within normal limits. No
mass or hydronephrosis visualized.

Abdominal aorta: No aneurysm visualized.

Other findings: There is no ascites.
IMPRESSION: 1. Gallstones with findings suspicious for acute cholecystitis.
2. Fatty infiltrative change of the liver.

## 2017-06-22 ENCOUNTER — Encounter (HOSPITAL_BASED_OUTPATIENT_CLINIC_OR_DEPARTMENT_OTHER): Payer: Self-pay

## 2017-06-22 ENCOUNTER — Other Ambulatory Visit: Payer: Self-pay

## 2017-06-22 ENCOUNTER — Emergency Department (HOSPITAL_BASED_OUTPATIENT_CLINIC_OR_DEPARTMENT_OTHER)
Admission: EM | Admit: 2017-06-22 | Discharge: 2017-06-22 | Disposition: A | Discharge status: Home / Self Care | Payer: Self-pay | Attending: Emergency Medicine | Admitting: Emergency Medicine

## 2017-06-22 DIAGNOSIS — M79671 Pain in right foot: Secondary | ICD-10-CM | POA: Insufficient documentation

## 2017-06-22 DIAGNOSIS — Z79899 Other long term (current) drug therapy: Secondary | ICD-10-CM | POA: Insufficient documentation

## 2017-06-22 DIAGNOSIS — I10 Essential (primary) hypertension: Secondary | ICD-10-CM | POA: Insufficient documentation

## 2017-06-22 DIAGNOSIS — Z7984 Long term (current) use of oral hypoglycemic drugs: Secondary | ICD-10-CM | POA: Insufficient documentation

## 2017-06-22 DIAGNOSIS — E119 Type 2 diabetes mellitus without complications: Secondary | ICD-10-CM | POA: Insufficient documentation

## 2017-06-22 MED ORDER — NAPROXEN 250 MG PO TABS
500.0000 mg | ORAL_TABLET | Freq: Once | ORAL | Status: AC
  Administered 2017-06-22: 500 mg via ORAL
  Filled 2017-06-22: qty 2

## 2017-06-22 MED ORDER — PREDNISONE 20 MG PO TABS: 40.0000 mg | ORAL_TABLET | Freq: Every day | ORAL | 0 refills | Status: DC

## 2017-06-22 MED ORDER — KETOROLAC TROMETHAMINE 60 MG/2ML IM SOLN: 60.0000 mg | Freq: Once | INTRAMUSCULAR | Status: DC

## 2017-06-22 MED ORDER — NAPROXEN 500 MG PO TABS: 500.0000 mg | ORAL_TABLET | Freq: Two times a day (BID) | ORAL | 0 refills | Status: AC

## 2017-06-22 NOTE — ED Provider Notes (Signed)
[facility] EMERGENCY DEPARTMENT Provider Note   CSN: [id number] Arrival date & time: 06/22/17  1821     History   Chief Complaint Chief Complaint  Patient presents with  . Foot Pain    HPI Jessica Robertson is a 52 y.o. female.  She is a 52 year old female with a history of diabetes, gout, plantar fasciitis presenting with 3 days of worsening right foot pain.  Patient states on Sunday she wore a flat uncomfortable shoe for the first time in quite some time.  Her feet were starting to hurt and she had to remove the shoes but noticed on Monday morning she had some mild pain in the pad of her foot.  It then became significantly worse over the lateral aspect of her foot and in the pad of her foot.  It is now 9 out of 10 and making it difficult to sleep because the pain is uncomfortable.  It is almost impossible for her to walk.  Pain is sharp and searing in nature and much worse with bearing weight.  She has been taking allopurinol given to her by a friend but is not made her pain any better.  She is also been taking some Tylenol.  She denies any fever but has noticed that her foot has been warm and swollen.  She states even air hitting it makes the pain worse.   The history is provided by the patient.    Past Medical History:  Diagnosis Date  . Diabetes mellitus without complication (HCC)   . Gout   . Hyperlipemia   . Hypertension   . Plantar fasciitis     Patient Active Problem List   Diagnosis Date Noted  . Controlled type 2 diabetes mellitus with hyperglycemia (HCC) 06/10/2016  . Acute pancreatitis 06/09/2016  . Hypertension   . Hyperlipemia   . Acute Cholecystitis s/p lap cholecystectomy 12/10/2015 12/10/2015    Past Surgical History:  Procedure Laterality Date  . ablation    . CHOLECYSTECTOMY N/A 12/11/2015   Procedure: LAPAROSCOPIC CHOLECYSTECTOMY WITH INTRAOPERATIVE CHOLANGIOGRAM;  Surgeon: Luretha Murphy, MD;  Location: [facility];  Service: General;  Laterality:  N/A;  . LYMPH NODE DISSECTION    . TUBAL LIGATION      OB History    No data available       Home Medications    Prior to Admission medications   Medication Sig Start Date End Date Taking? Authorizing Provider  amLODipine (NORVASC) 10 MG tablet Take 1 tablet (10 mg total) by mouth daily. 06/13/16   Rai, Delene Ruffini, MD  atorvastatin (LIPITOR) 20 MG tablet Take 20 mg by mouth daily.    [provider]  metFORMIN (GLUCOPHAGE) 500 MG tablet Take 500 mg by mouth daily.     [provider]    Family History Family History  Problem Relation Age of Onset  . Hypertension Other     Social History Social History   Tobacco Use  . Smoking status: Never Smoker  . Smokeless tobacco: Never Used  Substance Use Topics  . Alcohol use: No  . Drug use: No     Allergies   Patient has no known allergies.   Review of Systems Review of Systems  All other systems reviewed and are negative.    Physical Exam Updated Vital Signs BP (!) 158/87   Pulse 92   Temp 99.1 F (37.3 C) (Oral)   Resp 18   Ht 5\' 5"  (1.651 m)   Wt 104.3 kg (  230 lb)   SpO2 98%   BMI 38.27 kg/m   Physical Exam  Constitutional: She is oriented to person, place, and time. She appears well-developed and well-nourished. No distress.  HENT:  Head: Normocephalic and atraumatic.  Eyes: Pupils are equal, round, and reactive to light.  Cardiovascular: Normal rate.  Pulmonary/Chest: Effort normal.  Musculoskeletal: She exhibits edema and tenderness.       Right foot: There is tenderness, bony tenderness and swelling.       Feet:  Neurological: She is alert and oriented to person, place, and time.  Skin: Skin is warm and dry.  Psychiatric: She has a normal mood and affect. Her behavior is normal.  Nursing note and vitals reviewed.    ED Treatments / Results  Labs (all labs ordered are listed, but only abnormal results are displayed) Labs Reviewed - No data to display  EKG  EKG  Interpretation None       Radiology No results found.  Procedures Procedures (including critical care time)  Medications Ordered in ED Medications  ketorolac (TORADOL) injection 60 mg (not administered)     Initial Impression / Assessment and Plan / ED Course  I have reviewed the triage vital signs and the nursing notes.  Pertinent labs & imaging results that were available during my care of the patient were reviewed by me and considered in my medical decision making (see chart for details).    Patient presenting with foot pain and swelling which could be more of a fasciitis versus gout.  Patient does not have involvement of the MTP joint or the ankle.  Patient has no fever and no evidence of a septic joint.  This did start after wearing uncomfortable shoes to church on Sunday.  She does have a history of gout and plantar fasciitis.  Will treat with anti-inflammatories and prednisone.  Patient also given a Cam walker and crutches.  Will give follow-up. Final Clinical Impressions(s) / ED Diagnoses   Final diagnoses:  Foot pain, right    ED Discharge Orders        Ordered    naproxen (NAPROSYN) 500 MG tablet  2 times daily     06/22/17 2114    predniSONE (DELTASONE) 20 MG tablet  Daily     02 /28/19 2114       Gwyneth SproutPlunkett, Jadah Bobak, MD 06/22/17 2116

## 2017-06-22 NOTE — ED Notes (Signed)
Pt. Did not take her B/P med today .... Encouraged Pt. To take her meds always unless there is a better reason than foot pain or an allergic reaction.

## 2017-06-22 NOTE — ED Notes (Signed)
Patient instructed on how to put on the cam walker and the correct way to use the crutches. Patient moved about in the room with out difficulty but kept stating I can't do this. Instructed patient that she was doing it correctly and the more she used the more comfortable she would become. Patient agreed to continue with use of can walker and crutches.

## 2017-06-22 NOTE — ED Notes (Signed)
Pt. Has no shortness of breath.. Pt. Took 4 med's for gout with no results and tylenol with no help with pain or inflammation she reports

## 2017-06-22 NOTE — ED Triage Notes (Signed)
Pt c/o pain to right foot x 2 days-states feels like gout-denies injury-presents to triage in w/c

## 2017-06-22 NOTE — ED Notes (Signed)
Pt states she is does not want to sit in chair anymore and is demanding a bed. Pt moved to treatment room.

## 2017-08-02 ENCOUNTER — Other Ambulatory Visit: Payer: Self-pay

## 2017-08-02 ENCOUNTER — Emergency Department (HOSPITAL_BASED_OUTPATIENT_CLINIC_OR_DEPARTMENT_OTHER): Payer: Self-pay

## 2017-08-02 ENCOUNTER — Emergency Department (HOSPITAL_BASED_OUTPATIENT_CLINIC_OR_DEPARTMENT_OTHER)
Admission: EM | Admit: 2017-08-02 | Discharge: 2017-08-02 | Disposition: A | Discharge status: Home / Self Care | Payer: Self-pay | Attending: Emergency Medicine | Admitting: Emergency Medicine

## 2017-08-02 ENCOUNTER — Encounter (HOSPITAL_BASED_OUTPATIENT_CLINIC_OR_DEPARTMENT_OTHER): Payer: Self-pay | Admitting: *Deleted

## 2017-08-02 DIAGNOSIS — I10 Essential (primary) hypertension: Secondary | ICD-10-CM | POA: Insufficient documentation

## 2017-08-02 DIAGNOSIS — E119 Type 2 diabetes mellitus without complications: Secondary | ICD-10-CM | POA: Insufficient documentation

## 2017-08-02 DIAGNOSIS — M79602 Pain in left arm: Secondary | ICD-10-CM | POA: Insufficient documentation

## 2017-08-02 MED ORDER — HYDROCODONE-ACETAMINOPHEN 5-325 MG PO TABS
1.0000 | ORAL_TABLET | Freq: Once | ORAL | Status: DC
  Filled 2017-08-02: qty 1

## 2017-08-02 MED ORDER — IBUPROFEN 400 MG PO TABS
400.0000 mg | ORAL_TABLET | Freq: Once | ORAL | Status: AC
  Administered 2017-08-02: 400 mg via ORAL
  Filled 2017-08-02: qty 1

## 2017-08-02 MED ORDER — HYDROCODONE-ACETAMINOPHEN 5-325 MG PO TABS: 1.0000 | ORAL_TABLET | Freq: Four times a day (QID) | ORAL | 0 refills | Status: DC | PRN

## 2017-08-02 MED ORDER — IBUPROFEN 400 MG PO TABS: 400.0000 mg | ORAL_TABLET | Freq: Four times a day (QID) | ORAL | 0 refills | Status: AC | PRN

## 2017-08-02 NOTE — ED Triage Notes (Signed)
Pt reports left arm "throbbing" from shoulder to elbow x yesterday. Denies any other c/o, denies injury or trauma.

## 2017-08-02 NOTE — ED Notes (Signed)
In US 

## 2017-08-02 NOTE — ED Provider Notes (Signed)
Emergency Department Provider Note   I have reviewed the triage vital signs and the nursing notes.   HISTORY  Chief Complaint Arm Pain   HPI Jessica Robertson is a 52 y.o. female with history of diabetes, hyperlipidemia and hypertension presents the emergency department today with atraumatic left arm pain.  Patient states it started this morning is kind of throbbing in nature seems to radiate down from the shoulder to the back of her arm almost to her elbow.  States that she knows of swelling she put on his shirt which was loose on one arm and tied on that arm which is abnormal for her.  She never had a blood clot that she knows of.  Once again she did not hit it on anything has not had any chest pain, shortness of breath or other associated symptoms besides the pain.  No history of neck issues at this time she has no neck pain.  No fevers or recent illnesses. No other associated or modifying symptoms.    Past Medical History:  Diagnosis Date  . Diabetes mellitus without complication (HCC)   . Gout   . Hyperlipemia   . Hypertension   . Plantar fasciitis     Patient Active Problem List   Diagnosis Date Noted  . Controlled type 2 diabetes mellitus with hyperglycemia (HCC) 06/10/2016  . Acute pancreatitis 06/09/2016  . Hypertension   . Hyperlipemia   . Acute Cholecystitis s/p lap cholecystectomy 12/10/2015 12/10/2015    Past Surgical History:  Procedure Laterality Date  . ablation    . CHOLECYSTECTOMY N/A 12/11/2015   Procedure: LAPAROSCOPIC CHOLECYSTECTOMY WITH INTRAOPERATIVE CHOLANGIOGRAM;  Surgeon: Luretha Murphy, MD;  Location: [facility];  Service: General;  Laterality: N/A;  . LYMPH NODE DISSECTION    . TUBAL LIGATION      Current Outpatient Rx  . Order #: [id number] Class: Print  . Order #: [id number] Class: Historical Med  . Order #: [id number] Class: Print  . Order #: [id number] Class: Print  . Order #: [id number] Class: Historical Med  . Order #: [id number] Class: Print  .  Order #: [id number] Class: Print    Allergies Patient has no known allergies.  Family History  Problem Relation Age of Onset  . Hypertension Other     Social History Social History   Tobacco Use  . Smoking status: Never Smoker  . Smokeless tobacco: Never Used  Substance Use Topics  . Alcohol use: No  . Drug use: No    Review of Systems  All other systems negative except as documented in the HPI. All pertinent positives and negatives as reviewed in the HPI. ____________________________________________   PHYSICAL EXAM:  VITAL SIGNS: ED Triage Vitals  Enc Vitals Group     BP 08/02/17 0849 (!) 144/81     Pulse Rate 08/02/17 0849 80     Resp 08/02/17 0849 18     Temp 08/02/17 0849 98.7 F (37.1 C)     Temp Source 08/02/17 0849 Oral     SpO2 08/02/17 0849 95 %     Weight 08/02/17 0856 223 lb (101.2 kg)     Height 08/02/17 0856 5\' 5"  (1.651 m)     Head Circumference --      Peak Flow --      Pain Score 08/02/17 0856 7     Pain Loc --      Pain Edu? --      Excl. in GC? --     Constitutional: Alert and oriented. Well appearing and in  no acute distress. Eyes: Conjunctivae are normal. PERRL. EOMI. Head: Atraumatic. Nose: No congestion/rhinnorhea. Mouth/Throat: Mucous membranes are moist.  Oropharynx non-erythematous. Neck: No stridor.  No meningeal signs.   Cardiovascular: Normal rate, regular rhythm. Good peripheral circulation. Grossly normal heart sounds.   Respiratory: Normal respiratory effort.  No retractions. Lungs CTAB. Gastrointestinal: Soft and nontender. No distention.  Musculoskeletal: No lower extremity tenderness nor edema. No gross deformities of extremities. Mild swelling of LUE without ttp, erythema or warmth. Neurologic:  Normal speech and language. No gross focal neurologic deficits are appreciated. Normal sensation, strength and coordination of LUE compared to right.  Skin:  Skin is warm, dry and intact. No rash  noted.  ____________________________________________  RADIOLOGY  US Venous Img Upper Uni Left  Result Date: 08/02/2017 CLINICAL DATA:  Left upper extremity pain and swelling, paresthesias EXAM: LEFT UPPER EXTREMITY VENOUS DOPPLER ULTRASOUND TECHNIQUE: Gray-scale sonography with graded compression, as well as color Doppler and duplex ultrasound were performed to evaluate the upper extremity deep venous system from the level of the subclavian vein and including the jugular, axillary, basilic, radial, ulnar and upper cephalic vein. Spectral Doppler was utilized to evaluate flow at rest and with distal augmentation maneuvers. COMPARISON:  None. FINDINGS: Contralateral Subclavian Vein: Respiratory phasicity is normal and symmetric with the symptomatic side. No evidence of thrombus. Normal compressibility. Internal Jugular Vein: No evidence of thrombus. Normal compressibility, respiratory phasicity and response to augmentation. Subclavian Vein: No evidence of thrombus. Normal compressibility, respiratory phasicity and response to augmentation. Axillary Vein: No evidence of thrombus. Normal compressibility, respiratory phasicity and response to augmentation. Cephalic Vein: No evidence of thrombus. Normal compressibility, respiratory phasicity and response to augmentation. Basilic Vein: No evidence of thrombus. Normal compressibility, respiratory phasicity and response to augmentation. Brachial Veins: No evidence of thrombus. Normal compressibility, respiratory phasicity and response to augmentation. Radial Veins: No evidence of thrombus. Normal compressibility, respiratory phasicity and response to augmentation. Ulnar Veins: No evidence of thrombus. Normal compressibility, respiratory phasicity and response to augmentation. Venous Reflux:  None visualized. Other Findings:  None visualized. IMPRESSION: No evidence of DVT within the left upper extremity. Electronically Signed   By: Judie Petit.  Shick M.D.   On: 08/02/2017  11:02   Dg Humerus Left  Result Date: 08/02/2017 CLINICAL DATA:  Left upper swelling, pain. EXAM: LEFT HUMERUS - 2+ VIEW COMPARISON:  None. FINDINGS: There is no evidence of fracture or other focal bone lesions. Soft tissues are unremarkable. IMPRESSION: Negative. Electronically Signed   By: Charlett Nose M.D.   On: 08/02/2017 11:24   ____________________________________________  INITIAL IMPRESSION / ASSESSMENT AND PLAN / ED COURSE  DVT? Drainage issue in chest considered as well? Will check [place] here and reevaluate with likely referral to PCP for further workup if no DVT.  XR and [place] negative. Pain improved. Pulses present. Low suspicion for emergent cause for symptoms at this time. Possibly cervical? Either way will follow up with PCP. Short course of pain meds/NSAIDs Rx'ed.   Pertinent labs & imaging results that were available during my care of the patient were reviewed by me and considered in my medical decision making (see chart for details).  ____________________________________________  FINAL CLINICAL IMPRESSION(S) / ED DIAGNOSES  Final diagnoses:  Left arm pain     MEDICATIONS GIVEN DURING THIS VISIT:  Medications  HYDROcodone-acetaminophen (NORCO/VICODIN) 5-325 MG per tablet 1 tablet (1 tablet Oral Refused 08/02/17 0924)  ibuprofen (ADVIL,MOTRIN) tablet 400 mg (400 mg Oral Given 08/02/17 0924)     NEW OUTPATIENT MEDICATIONS  STARTED DURING THIS VISIT:  Discharge Medication List as of 08/02/2017 11:49 AM    START taking these medications   Details  HYDROcodone-acetaminophen (NORCO/VICODIN) 5-325 MG tablet Take 1 tablet by mouth every 6 (six) hours as needed for severe pain., Starting Wed 08/02/2017, Print    ibuprofen (ADVIL,MOTRIN) 400 MG tablet Take 1 tablet (400 mg total) by mouth every 6 (six) hours as needed., Starting Wed 08/02/2017, Print        Note:  This note was prepared with assistance of Dragon voice recognition software. Occasional wrong-word or  sound-a-like substitutions may have occurred due to the inherent limitations of voice recognition software.   Marily MemosMesner, Layce Sprung, MD 08/02/17 1209

## 2017-09-14 ENCOUNTER — Encounter (HOSPITAL_BASED_OUTPATIENT_CLINIC_OR_DEPARTMENT_OTHER): Payer: Self-pay

## 2017-09-14 ENCOUNTER — Other Ambulatory Visit: Payer: Self-pay

## 2017-09-14 DIAGNOSIS — I10 Essential (primary) hypertension: Secondary | ICD-10-CM | POA: Insufficient documentation

## 2017-09-14 DIAGNOSIS — E119 Type 2 diabetes mellitus without complications: Secondary | ICD-10-CM | POA: Insufficient documentation

## 2017-09-14 DIAGNOSIS — J0100 Acute maxillary sinusitis, unspecified: Secondary | ICD-10-CM | POA: Insufficient documentation

## 2017-09-14 DIAGNOSIS — Z79899 Other long term (current) drug therapy: Secondary | ICD-10-CM | POA: Insufficient documentation

## 2017-09-14 DIAGNOSIS — Z7984 Long term (current) use of oral hypoglycemic drugs: Secondary | ICD-10-CM | POA: Insufficient documentation

## 2017-09-14 NOTE — ED Triage Notes (Signed)
Pt c/o head and nasal congestion x 1wk

## 2017-09-15 ENCOUNTER — Emergency Department (HOSPITAL_BASED_OUTPATIENT_CLINIC_OR_DEPARTMENT_OTHER)
Admission: EM | Admit: 2017-09-15 | Discharge: 2017-09-15 | Disposition: A | Discharge status: Home / Self Care | Payer: Self-pay | Attending: Emergency Medicine | Admitting: Emergency Medicine

## 2017-09-15 DIAGNOSIS — J0100 Acute maxillary sinusitis, unspecified: Secondary | ICD-10-CM

## 2017-09-15 MED ORDER — DOXYCYCLINE HYCLATE 100 MG PO CAPS: 100.0000 mg | ORAL_CAPSULE | Freq: Two times a day (BID) | ORAL | 0 refills | Status: DC

## 2017-09-15 MED ORDER — OXYMETAZOLINE HCL 0.05 % NA SOLN
2.0000 | Freq: Two times a day (BID) | NASAL | Status: DC | PRN
  Administered 2017-09-15: 2 via NASAL
  Filled 2017-09-15: qty 15

## 2017-09-15 MED ORDER — DOXYCYCLINE HYCLATE 100 MG PO TABS
100.0000 mg | ORAL_TABLET | Freq: Once | ORAL | Status: AC
  Administered 2017-09-15: 100 mg via ORAL
  Filled 2017-09-15: qty 1

## 2017-09-15 NOTE — ED Notes (Signed)
Pt verbalizes understanding of d/c instructions and denies any further need at this time. 

## 2017-09-15 NOTE — ED Notes (Signed)
Pt believes she has a sinus infection.  She c/o congestion that inhibits her sleep.  Incidentally, pt had to be woken up when nurse arrived in room to assess her.  Pt has been using muccinex dm without relief.

## 2017-09-15 NOTE — ED Provider Notes (Signed)
[facility] Provider Note: Lowella Dell, MD, FACEP  CSN: [id number] MRN: [id number] ARRIVAL: 09/14/17 at 2318 ROOM: [facility]05   CHIEF COMPLAINT  Nasal Congestion   HISTORY OF PRESENT ILLNESS  09/15/17 1:58 AM Jessica Robertson is a 52 y.o. female with a one-week history of nasal congestion, frequent blowing of the nose with thick yellow mucus, facial pressure and postnasal drip.  She is not sure if she has had a fever.  She has had a cough.  Symptoms are worse at night and severe enough to keep her from sleeping.  She has been taking Mucinex DM without adequate relief.  She has not been using any topical decongestants.  She denies a headache but is having soreness of the nares from frequent nose blowing.   Past Medical History:  Diagnosis Date  . Diabetes mellitus without complication (HCC)   . Gout   . Hyperlipemia   . Hypertension   . Plantar fasciitis     Past Surgical History:  Procedure Laterality Date  . ablation    . CHOLECYSTECTOMY N/A 12/11/2015   Procedure: LAPAROSCOPIC CHOLECYSTECTOMY WITH INTRAOPERATIVE CHOLANGIOGRAM;  Surgeon: Luretha Murphy, MD;  Location: [facility];  Service: General;  Laterality: N/A;  . LYMPH NODE DISSECTION    . TUBAL LIGATION      Family History  Problem Relation Age of Onset  . Hypertension Other     Social History   Tobacco Use  . Smoking status: Never Smoker  . Smokeless tobacco: Never Used  Substance Use Topics  . Alcohol use: No  . Drug use: No    Prior to Admission medications   Medication Sig Start Date End Date Taking? Authorizing Provider  amLODipine (NORVASC) 10 MG tablet Take 1 tablet (10 mg total) by mouth daily. 06/13/16   Rai, Delene Ruffini, MD  atorvastatin (LIPITOR) 20 MG tablet Take 20 mg by mouth daily.    [provider]  HYDROcodone-acetaminophen (NORCO/VICODIN) 5-325 MG tablet Take 1 tablet by mouth every 6 (six) hours as needed for severe pain. 08/02/17   Mesner, Barbara Cower, MD  ibuprofen  (ADVIL,MOTRIN) 400 MG tablet Take 1 tablet (400 mg total) by mouth every 6 (six) hours as needed. 08/02/17   Mesner, Barbara Cower, MD  metFORMIN (GLUCOPHAGE) 500 MG tablet Take 500 mg by mouth daily.     [provider]  naproxen (NAPROSYN) 500 MG tablet Take 1 tablet (500 mg total) by mouth 2 (two) times daily. 06/22/17   Gwyneth Sprout, MD  predniSONE (DELTASONE) 20 MG tablet Take 2 tablets (40 mg total) by mouth daily. 06/22/17   Gwyneth Sprout, MD    Allergies Patient has no known allergies.   REVIEW OF SYSTEMS  Negative except as noted here or in the History of Present Illness.   PHYSICAL EXAMINATION  Initial Vital Signs Blood pressure (!) 151/91, pulse 85, temperature 98.3 F (36.8 C), temperature source Oral, height  (1.651 m), weight 99.8 kg (220 lb), SpO2 94 %.  Examination General: Well-developed, well-nourished female in no acute distress; appearance consistent with age of record HENT: normocephalic; atraumatic; nasal congestion; no pharyngeal erythema or exudate Eyes: pupils equal, round and reactive to light; extraocular muscles intact Neck: supple Heart: regular rate and rhythm Lungs: clear to auscultation bilaterally Abdomen: soft; nondistended; nontender; bowel sounds present Extremities: No deformity; full range of motion; pulses normal Neurologic: Awake, alert and oriented; motor function intact in all extremities and symmetric; no facial droop Skin: Warm and dry Psychiatric: Flat affect  RESULTS  Summary of this visit's results, reviewed by myself:   EKG Interpretation  Date/Time:    Ventricular Rate:    PR Interval:    QRS Duration:   QT Interval:    QTC Calculation:   R Axis:     Text Interpretation:        Laboratory Studies: No results found for this or any previous visit (from the past 24 hour(s)). Imaging Studies: No results found.  ED COURSE and MDM  Nursing notes and initial vitals signs, including pulse oximetry,  reviewed.  Vitals:   09/14/17 2336 09/14/17 2338  BP:  (!) 151/91  Pulse:  85  Temp:  98.3 F (36.8 C)  TempSrc:  Oral  SpO2:  94%  Weight: 99.8 kg (220 lb)   Height:  (1.651 m)    We will treat with doxycycline for acute sinusitis.  PROCEDURES    ED DIAGNOSES     ICD-10-CM   1. Acute non-recurrent maxillary sinusitis J01.00        Filipe Greathouse, MD 09/15/17 4782

## 2018-02-02 ENCOUNTER — Emergency Department (HOSPITAL_BASED_OUTPATIENT_CLINIC_OR_DEPARTMENT_OTHER)
Admission: EM | Admit: 2018-02-02 | Discharge: 2018-02-02 | Disposition: A | Discharge status: Home / Self Care | Payer: Self-pay | Attending: Emergency Medicine | Admitting: Emergency Medicine

## 2018-02-02 ENCOUNTER — Encounter (HOSPITAL_BASED_OUTPATIENT_CLINIC_OR_DEPARTMENT_OTHER): Payer: Self-pay | Admitting: *Deleted

## 2018-02-02 ENCOUNTER — Emergency Department (HOSPITAL_BASED_OUTPATIENT_CLINIC_OR_DEPARTMENT_OTHER): Payer: Self-pay

## 2018-02-02 ENCOUNTER — Other Ambulatory Visit: Payer: Self-pay

## 2018-02-02 DIAGNOSIS — R0602 Shortness of breath: Secondary | ICD-10-CM | POA: Insufficient documentation

## 2018-02-02 DIAGNOSIS — R05 Cough: Secondary | ICD-10-CM

## 2018-02-02 DIAGNOSIS — J209 Acute bronchitis, unspecified: Secondary | ICD-10-CM | POA: Insufficient documentation

## 2018-02-02 DIAGNOSIS — Z79899 Other long term (current) drug therapy: Secondary | ICD-10-CM | POA: Insufficient documentation

## 2018-02-02 DIAGNOSIS — R059 Cough, unspecified: Secondary | ICD-10-CM

## 2018-02-02 DIAGNOSIS — Z7984 Long term (current) use of oral hypoglycemic drugs: Secondary | ICD-10-CM | POA: Insufficient documentation

## 2018-02-02 DIAGNOSIS — E119 Type 2 diabetes mellitus without complications: Secondary | ICD-10-CM | POA: Insufficient documentation

## 2018-02-02 DIAGNOSIS — I10 Essential (primary) hypertension: Secondary | ICD-10-CM | POA: Insufficient documentation

## 2018-02-02 LAB — CBC WITH DIFFERENTIAL/PLATELET
Abs Immature Granulocytes: 0 10*3/uL (ref 0.00–0.07)
BASOS ABS: 0 10*3/uL (ref 0.0–0.1)
Basophils Relative: 1 %
EOS ABS: 0.1 10*3/uL (ref 0.0–0.5)
EOS PCT: 3 %
HCT: 41.4 % (ref 36.0–46.0)
Hemoglobin: 12.7 g/dL (ref 12.0–15.0)
IMMATURE GRANULOCYTES: 0 %
Lymphocytes Relative: 40 %
Lymphs Abs: 1.7 10*3/uL (ref 0.7–4.0)
MCH: 27.9 pg (ref 26.0–34.0)
MCHC: 30.7 g/dL (ref 30.0–36.0)
MCV: 90.8 fL (ref 80.0–100.0)
Monocytes Absolute: 0.6 10*3/uL (ref 0.1–1.0)
Monocytes Relative: 14 %
NEUTROS PCT: 42 %
NRBC: 0 % (ref 0.0–0.2)
Neutro Abs: 1.7 10*3/uL (ref 1.7–7.7)
PLATELETS: 223 10*3/uL (ref 150–400)
RBC: 4.56 MIL/uL (ref 3.87–5.11)
RDW: 12.9 % (ref 11.5–15.5)
WBC: 4 10*3/uL (ref 4.0–10.5)

## 2018-02-02 LAB — BASIC METABOLIC PANEL
ANION GAP: 7 (ref 5–15)
BUN: 10 mg/dL (ref 6–20)
CALCIUM: 10.6 mg/dL — AB (ref 8.9–10.3)
CHLORIDE: 105 mmol/L (ref 98–111)
CO2: 28 mmol/L (ref 22–32)
CREATININE: 0.87 mg/dL (ref 0.44–1.00)
GFR calc non Af Amer: >60 mL/min (ref >60)
Glucose, Bld: 169 mg/dL — ABNORMAL HIGH (ref 70–99)
Potassium: 3.8 mmol/L (ref 3.5–5.1)
SODIUM: 140 mmol/L (ref 135–145)

## 2018-02-02 LAB — BRAIN NATRIURETIC PEPTIDE: B NATRIURETIC PEPTIDE 5: 60.2 pg/mL (ref 0.0–100.0)

## 2018-02-02 LAB — TROPONIN I
TROPONIN I: 0.03 ng/mL — AB (ref <0.03)
TROPONIN I: 0.03 ng/mL — AB (ref <0.03)

## 2018-02-02 MED ORDER — ALBUTEROL SULFATE (2.5 MG/3ML) 0.083% IN NEBU
5.0000 mg | INHALATION_SOLUTION | Freq: Once | RESPIRATORY_TRACT | Status: AC
  Administered 2018-02-02: 5 mg via RESPIRATORY_TRACT
  Filled 2018-02-02: qty 6

## 2018-02-02 MED ORDER — AMLODIPINE BESYLATE 5 MG PO TABS
10.0000 mg | ORAL_TABLET | Freq: Every day | ORAL | Status: DC
  Administered 2018-02-02: 10 mg via ORAL
  Filled 2018-02-02: qty 2

## 2018-02-02 MED ORDER — BENZONATATE 100 MG PO CAPS: 100.0000 mg | ORAL_CAPSULE | Freq: Three times a day (TID) | ORAL | 0 refills | Status: AC | PRN

## 2018-02-02 NOTE — ED Notes (Signed)
ED Provider at bedside. 

## 2018-02-02 NOTE — Discharge Instructions (Signed)
Remember to take your blood pressure medication as prescribed.  Please follow up with your family doctor for recheck. Get rechecked immediately if you have any new or worsening symptoms.

## 2018-02-02 NOTE — ED Triage Notes (Signed)
Pt c/o URI symptoms with cough x 1 week

## 2018-02-02 NOTE — ED Provider Notes (Signed)
[facility] EMERGENCY DEPARTMENT Provider Note   CSN: [id number] Arrival date & time: 02/02/18  1447     History   Chief Complaint Chief Complaint  Patient presents with  . Cough    HPI Jessica Robertson is a 52 y.o. female.  The history is provided by the patient. No language interpreter was used.  Cough    Jessica Robertson is a 52 y.o. female who presents to the Emergency Department complaining of cough. She presents to the emergency department for evaluation of cough for the last week. She reports a cough productive of sputum, sometimes yellow in color for one week. Over the last 24 hours she reports increased swelling and bilateral lower extremities as well as increased shortness of breath with orthopnea and dyspnea on exertion. She denies any fevers, nasal congestion, chest pain, abdominal pain. She does have mild nausea. She has a history of hypertension and diabetes. She is not taken her blood pressure medications for the last two days because she has not been feeling well. She has been trying to treat her symptoms with cough drops with no significant improvement. She feels like she is wheezy at times. She does not smoke, drink alcohol or use drugs. No history of COPD or asthma. Past Medical History:  Diagnosis Date  . Diabetes mellitus without complication (HCC)   . Gout   . Hyperlipemia   . Hypertension   . Plantar fasciitis     Patient Active Problem List   Diagnosis Date Noted  . Controlled type 2 diabetes mellitus with hyperglycemia (HCC) 06/10/2016  . Acute pancreatitis 06/09/2016  . Hypertension   . Hyperlipemia   . Acute Cholecystitis s/p lap cholecystectomy 12/10/2015 12/10/2015    Past Surgical History:  Procedure Laterality Date  . ablation    . CHOLECYSTECTOMY N/A 12/11/2015   Procedure: LAPAROSCOPIC CHOLECYSTECTOMY WITH INTRAOPERATIVE CHOLANGIOGRAM;  Surgeon: Luretha Murphy, MD;  Location: [facility];  Service: General;  Laterality: N/A;  . LYMPH  NODE DISSECTION    . TUBAL LIGATION       OB History   None      Home Medications    Prior to Admission medications   Medication Sig Start Date End Date Taking? Authorizing Provider  lisinopril (PRINIVIL,ZESTRIL) 40 MG tablet Take by mouth. 02/02/16  Yes [provider]  amLODipine (NORVASC) 10 MG tablet Take 1 tablet (10 mg total) by mouth daily. 06/13/16   Rai, Delene Ruffini, MD  atorvastatin (LIPITOR) 20 MG tablet Take 20 mg by mouth daily.    [provider]  benzonatate (TESSALON) 100 MG capsule Take 1 capsule (100 mg total) by mouth 3 (three) times daily as needed for cough. 02/02/18   Tilden Fossa, MD  ibuprofen (ADVIL,MOTRIN) 400 MG tablet Take 1 tablet (400 mg total) by mouth every 6 (six) hours as needed. 08/02/17   Mesner, Barbara Cower, MD  metFORMIN (GLUCOPHAGE) 500 MG tablet Take 500 mg by mouth daily.     [provider]  naproxen (NAPROSYN) 500 MG tablet Take 1 tablet (500 mg total) by mouth 2 (two) times daily. 06/22/17   Gwyneth Sprout, MD    Family History Family History  Problem Relation Age of Onset  . Hypertension Other     Social History Social History   Tobacco Use  . Smoking status: Never Smoker  . Smokeless tobacco: Never Used  Substance Use Topics  . Alcohol use: No  . Drug use: No     Allergies   Patient has no  known allergies.   Review of Systems Review of Systems  Respiratory: Positive for cough.   All other systems reviewed and are negative.    Physical Exam Updated Vital Signs BP (!) 176/101   Pulse 95   Temp 98.1 F (36.7 C) (Oral)   Resp 16   Ht 5\' 4"  (1.626 m)   Wt 99.8 kg   SpO2 96%   BMI 37.76 kg/m   Physical Exam  Constitutional: She is oriented to person, place, and time. She appears well-developed and well-nourished.  HENT:  Head: Normocephalic and atraumatic.  Cardiovascular: Normal rate and regular rhythm.  No murmur heard. Pulmonary/Chest: Effort normal and breath sounds normal. No  respiratory distress.  Abdominal: Soft. There is no tenderness. There is no rebound and no guarding.  Musculoskeletal: She exhibits no tenderness.  Trace pitting edema to BLE  Neurological: She is alert and oriented to person, place, and time.  Skin: Skin is warm and dry.  Psychiatric: She has a normal mood and affect. Her behavior is normal.  Nursing note and vitals reviewed.    ED Treatments / Results  Labs (all labs ordered are listed, but only abnormal results are displayed) Labs Reviewed  BASIC METABOLIC PANEL - Abnormal; Notable for the following components:      Result Value   Glucose, Bld 169 (*)    Calcium 10.6 (*)    All other components within normal limits  TROPONIN I - Abnormal; Notable for the following components:   Troponin I 0.03 (*)    All other components within normal limits  TROPONIN I - Abnormal; Notable for the following components:   Troponin I 0.03 (*)    All other components within normal limits  CBC WITH DIFFERENTIAL/PLATELET  BRAIN NATRIURETIC PEPTIDE    EKG EKG Interpretation  Date/Time:  Friday February 02 2018 15:51:32 EDT Ventricular Rate:  80 PR Interval:    QRS Duration: 104 QT Interval:  372 QTC Calculation: 430 R Axis:   33 Text Interpretation:  Sinus rhythm Probable anteroseptal infarct, recent Baseline wander in lead(s) II III aVF V1 V3 V5 V6 No significant change since last tracing Confirmed by Tilden Fossa [id number]) on 02/02/2018 4:03:56 PM Also confirmed by Tilden Fossa [phone]), editor Sheppard Evens ([id number])  on 02/02/2018 4:25:34 PM   Radiology Dg Chest 2 View  Result Date: 02/02/2018 CLINICAL DATA:  Cough and congestion for 1 week. EXAM: CHEST - 2 VIEW COMPARISON:  None. FINDINGS: The lungs are clear. Heart size is upper normal. No pneumothorax or pleural fluid. No acute or focal bony abnormality. IMPRESSION: No acute disease. Electronically Signed   By: Drusilla Kanner M.D.   On: 02/02/2018 16:38     Procedures Procedures (including critical care time)  Medications Ordered in ED Medications  amLODipine (NORVASC) tablet 10 mg (10 mg Oral Given 02/02/18 1707)  albuterol (PROVENTIL) (2.5 MG/3ML) 0.083% nebulizer solution 5 mg (5 mg Nebulization Given 02/02/18 1707)     Initial Impression / Assessment and Plan / ED Course  I have reviewed the triage vital signs and the nursing notes.  Pertinent labs & imaging results that were available during my care of the patient were reviewed by me and considered in my medical decision making (see chart for details).     Should here for evaluation of one week of cough productive of yellow sputum. She is non-toxic appearing on examination with no respiratory distress. She is not taken her blood pressure medications for the last two days. There is  no evidence of CHF. Presentation is not consistent with ACS. EKG has changes of LVH that are similar compared to prior. Presentation is not consistent with PE, pneumonia. Discussed with patient home care for URI with cough. Discussed outpatient follow-up as well as close return precautions.  Final Clinical Impressions(s) / ED Diagnoses   Final diagnoses:  Cough  Acute bronchitis, unspecified organism    ED Discharge Orders         Ordered    benzonatate (TESSALON) 100 MG capsule  3 times daily PRN     02/02/18 1909           Tilden Fossa, MD 02/02/18 1912

## 2018-02-02 NOTE — ED Notes (Signed)
Date and time results received: 02/02/18 1850 (use smartphrase ".now" to insert current time)  Test:Troponin Critical Value:0.03  Name of Provider Notified: Dr. Pecola Leisure  Orders Received? Or Actions Taken?:

## 2018-05-15 ENCOUNTER — Other Ambulatory Visit: Payer: Self-pay

## 2018-05-15 ENCOUNTER — Emergency Department (HOSPITAL_BASED_OUTPATIENT_CLINIC_OR_DEPARTMENT_OTHER)
Admission: EM | Admit: 2018-05-15 | Discharge: 2018-05-15 | Disposition: A | Discharge status: Home / Self Care | Payer: BLUE CROSS/BLUE SHIELD | Attending: Emergency Medicine | Admitting: Emergency Medicine

## 2018-05-15 ENCOUNTER — Encounter (HOSPITAL_BASED_OUTPATIENT_CLINIC_OR_DEPARTMENT_OTHER): Payer: Self-pay | Admitting: Emergency Medicine

## 2018-05-15 DIAGNOSIS — L819 Disorder of pigmentation, unspecified: Secondary | ICD-10-CM | POA: Diagnosis not present

## 2018-05-15 DIAGNOSIS — Z79899 Other long term (current) drug therapy: Secondary | ICD-10-CM | POA: Diagnosis not present

## 2018-05-15 DIAGNOSIS — I10 Essential (primary) hypertension: Secondary | ICD-10-CM | POA: Diagnosis not present

## 2018-05-15 DIAGNOSIS — E119 Type 2 diabetes mellitus without complications: Secondary | ICD-10-CM | POA: Insufficient documentation

## 2018-05-15 DIAGNOSIS — K1370 Unspecified lesions of oral mucosa: Secondary | ICD-10-CM | POA: Diagnosis present

## 2018-05-15 DIAGNOSIS — Z7984 Long term (current) use of oral hypoglycemic drugs: Secondary | ICD-10-CM | POA: Insufficient documentation

## 2018-05-15 MED ORDER — SILVER NITRATE-POT NITRATE 75-25 % EX MISC
CUTANEOUS | Status: AC
  Filled 2018-05-15: qty 2

## 2018-05-15 NOTE — ED Provider Notes (Signed)
[facility] EMERGENCY DEPARTMENT Provider Note   CSN: [id number] Arrival date & time: 05/15/18  1731     History   Chief Complaint Chief Complaint  Patient presents with  . Mouth Lesions    HPI Jessica Robertson is a 53 y.o. female.  52yo female presents with lesion to left lower lip x 1 month, bleeding since removing scab at work today. Patient suspects the area is not healing because she constantly picks at it. Called to make an appointment with dermatology but has a 1 month wait and did not schedule the appointment. Not on blood thinners, no other complaints or concerns.      Past Medical History:  Diagnosis Date  . Diabetes mellitus without complication (HCC)   . Gout   . Hyperlipemia   . Hypertension   . Plantar fasciitis     Patient Active Problem List   Diagnosis Date Noted  . Controlled type 2 diabetes mellitus with hyperglycemia (HCC) 06/10/2016  . Acute pancreatitis 06/09/2016  . Hypertension   . Hyperlipemia   . Acute Cholecystitis s/p lap cholecystectomy 12/10/2015 12/10/2015    Past Surgical History:  Procedure Laterality Date  . ablation    . CHOLECYSTECTOMY N/A 12/11/2015   Procedure: LAPAROSCOPIC CHOLECYSTECTOMY WITH INTRAOPERATIVE CHOLANGIOGRAM;  Surgeon: Luretha Kourtnee Lahey, MD;  Location: [facility];  Service: General;  Laterality: N/A;  . LYMPH NODE DISSECTION    . TUBAL LIGATION       OB History   No obstetric history on file.      Home Medications    Prior to Admission medications   Medication Sig Start Date End Date Taking? Authorizing Provider  amLODipine (NORVASC) 10 MG tablet Take 1 tablet (10 mg total) by mouth daily. 06/13/16   Rai, Delene Ruffini, MD  atorvastatin (LIPITOR) 20 MG tablet Take 20 mg by mouth daily.    [provider]  benzonatate (TESSALON) 100 MG capsule Take 1 capsule (100 mg total) by mouth 3 (three) times daily as needed for cough. 02/02/18   Tilden Fossa, MD  ibuprofen (ADVIL,MOTRIN) 400 MG tablet Take  1 tablet (400 mg total) by mouth every 6 (six) hours as needed. 08/02/17   Mesner, Barbara Cower, MD  lisinopril (PRINIVIL,ZESTRIL) 40 MG tablet Take by mouth. 02/02/16   [provider]  metFORMIN (GLUCOPHAGE) 500 MG tablet Take 500 mg by mouth daily.     [provider]  naproxen (NAPROSYN) 500 MG tablet Take 1 tablet (500 mg total) by mouth 2 (two) times daily. 06/22/17   Gwyneth Sprout, MD    Family History Family History  Problem Relation Age of Onset  . Hypertension Other     Social History Social History   Tobacco Use  . Smoking status: Never Smoker  . Smokeless tobacco: Never Used  Substance Use Topics  . Alcohol use: No  . Drug use: No     Allergies   Patient has no known allergies.   Review of Systems Review of Systems  Constitutional: Negative for fever.  Skin: Positive for wound.  Allergic/Immunologic: Negative for immunocompromised state.  Hematological: Negative for adenopathy. Does not bruise/bleed easily.  Psychiatric/Behavioral: Negative for confusion.     Physical Exam Updated Vital Signs BP (!) 188/111 (BP Location: Right Arm)   Pulse 90   Temp 99.2 F (37.3 C) (Oral)   Resp 16   Ht 5\' 4"  (1.626 m)   Wt 99.8 kg   SpO2 99%   BMI 37.76 kg/m   Physical Exam Vitals  signs and nursing note reviewed.  Constitutional:      General: She is not in acute distress.    Appearance: She is well-developed. She is not diaphoretic.  HENT:     Head: Normocephalic and atraumatic.   Pulmonary:     Effort: Pulmonary effort is normal.  Skin:    General: Skin is warm and dry.  Neurological:     Mental Status: She is alert and oriented to person, place, and time.  Psychiatric:        Behavior: Behavior normal.      ED Treatments / Results  Labs (all labs ordered are listed, but only abnormal results are displayed) Labs Reviewed - No data to display  EKG None  Radiology No results found.  Procedures Procedures (including critical  care time)  Medications Ordered in ED Medications  silver nitrate applicators 75-25 % applicator (has no administration in time range)     Initial Impression / Assessment and Plan / ED Course  I have reviewed the triage vital signs and the nursing notes.  Pertinent labs & imaging results that were available during my care of the patient were reviewed by me and considered in my medical decision making (see chart for details).  Clinical Course as of May 15 1801  Tue May 15, 2018  [id number] 53 year old female with left lower lip lesion which is bleeding at this time.  Bleeding is controlled with pressure and resumption pressure is removed.  Area was treated with silver nitrate, bleeding is controlled.  Advised patient to leave the area alone and follow-up with dermatology for further evaluation to rule out skin cancer.  Patient verbalizes understanding, agrees to schedule appointment with dermatology.   [LM]    Clinical Course User Index [LM] Jeannie Fend, PA-C   Final Clinical Impressions(s) / ED Diagnoses   Final diagnoses:  Bleeding pigmented skin lesion    ED Discharge Orders    None       Jeannie Fend, PA-C 05/15/18 1803    Virgina Norfolk, DO 05/15/18 1808

## 2018-05-15 NOTE — Discharge Instructions (Addendum)
Follow-up with dermatology as discussed.  Return to ER if bleeding returns and does not resolve with pressure.

## 2018-05-15 NOTE — ED Triage Notes (Signed)
Reports lesion to left side of mouth x 1 month.  States this is not healing.

## 2018-05-15 NOTE — ED Notes (Signed)
Pt states that she has blister to bottom lip x 1 month with scabbing. Pt pulled off scab and now has active bleeding. Pt denies burning, or itching.

## 2018-05-24 ENCOUNTER — Emergency Department (HOSPITAL_BASED_OUTPATIENT_CLINIC_OR_DEPARTMENT_OTHER): Payer: BLUE CROSS/BLUE SHIELD

## 2018-05-24 ENCOUNTER — Other Ambulatory Visit: Payer: Self-pay

## 2018-05-24 ENCOUNTER — Emergency Department (HOSPITAL_BASED_OUTPATIENT_CLINIC_OR_DEPARTMENT_OTHER)
Admission: EM | Admit: 2018-05-24 | Discharge: 2018-05-24 | Disposition: A | Discharge status: Home / Self Care | Payer: BLUE CROSS/BLUE SHIELD | Attending: Emergency Medicine | Admitting: Emergency Medicine

## 2018-05-24 ENCOUNTER — Encounter (HOSPITAL_BASED_OUTPATIENT_CLINIC_OR_DEPARTMENT_OTHER): Payer: Self-pay

## 2018-05-24 DIAGNOSIS — I10 Essential (primary) hypertension: Secondary | ICD-10-CM | POA: Diagnosis not present

## 2018-05-24 DIAGNOSIS — E785 Hyperlipidemia, unspecified: Secondary | ICD-10-CM | POA: Diagnosis not present

## 2018-05-24 DIAGNOSIS — R202 Paresthesia of skin: Secondary | ICD-10-CM | POA: Diagnosis present

## 2018-05-24 DIAGNOSIS — R200 Anesthesia of skin: Secondary | ICD-10-CM | POA: Insufficient documentation

## 2018-05-24 DIAGNOSIS — E119 Type 2 diabetes mellitus without complications: Secondary | ICD-10-CM | POA: Diagnosis not present

## 2018-05-24 DIAGNOSIS — Z79899 Other long term (current) drug therapy: Secondary | ICD-10-CM | POA: Diagnosis not present

## 2018-05-24 LAB — COMPREHENSIVE METABOLIC PANEL
ALK PHOS: 94 U/L (ref 38–126)
ALT: 22 U/L (ref 0–44)
AST: 19 U/L (ref 15–41)
Albumin: 3.9 g/dL (ref 3.5–5.0)
Anion gap: 6 (ref 5–15)
BILIRUBIN TOTAL: 0.6 mg/dL (ref 0.3–1.2)
BUN: 14 mg/dL (ref 6–20)
CO2: 25 mmol/L (ref 22–32)
Calcium: 10.7 mg/dL — ABNORMAL HIGH (ref 8.9–10.3)
Chloride: 104 mmol/L (ref 98–111)
Creatinine, Ser: 0.69 mg/dL (ref 0.44–1.00)
GFR calc Af Amer: >60 mL/min (ref >60)
GFR calc non Af Amer: >60 mL/min (ref >60)
GLUCOSE: 125 mg/dL — AB (ref 70–99)
Potassium: 3.8 mmol/L (ref 3.5–5.1)
Sodium: 135 mmol/L (ref 135–145)
TOTAL PROTEIN: 7.7 g/dL (ref 6.5–8.1)

## 2018-05-24 LAB — CBC WITH DIFFERENTIAL/PLATELET
ABS IMMATURE GRANULOCYTES: 0.01 10*3/uL (ref 0.00–0.07)
BASOS ABS: 0 10*3/uL (ref 0.0–0.1)
Basophils Relative: 0 %
Eosinophils Absolute: 0.1 10*3/uL (ref 0.0–0.5)
Eosinophils Relative: 2 %
HEMATOCRIT: 40.9 % (ref 36.0–46.0)
HEMOGLOBIN: 12.4 g/dL (ref 12.0–15.0)
IMMATURE GRANULOCYTES: 0 %
LYMPHS ABS: 1.7 10*3/uL (ref 0.7–4.0)
LYMPHS PCT: 36 %
MCH: 27 pg (ref 26.0–34.0)
MCHC: 30.3 g/dL (ref 30.0–36.0)
MCV: 89.1 fL (ref 80.0–100.0)
Monocytes Absolute: 0.6 10*3/uL (ref 0.1–1.0)
Monocytes Relative: 13 %
NEUTROS PCT: 49 %
NRBC: 0 % (ref 0.0–0.2)
Neutro Abs: 2.3 10*3/uL (ref 1.7–7.7)
Platelets: 220 10*3/uL (ref 150–400)
RBC: 4.59 MIL/uL (ref 3.87–5.11)
RDW: 12.9 % (ref 11.5–15.5)
WBC: 4.7 10*3/uL (ref 4.0–10.5)

## 2018-05-24 LAB — TROPONIN I: Troponin I: 0.03 ng/mL (ref <0.03)

## 2018-05-24 MED ORDER — METHYLPREDNISOLONE 4 MG PO TBPK: ORAL_TABLET | ORAL | 0 refills | Status: DC

## 2018-05-24 MED ORDER — METHYLPREDNISOLONE 4 MG PO TBPK: ORAL_TABLET | ORAL | 0 refills | Status: AC

## 2018-05-24 NOTE — ED Notes (Addendum)
Critical Troponin 0.03

## 2018-05-24 NOTE — ED Provider Notes (Signed)
[facility] EMERGENCY DEPARTMENT Provider Note   CSN: [id number] Arrival date & time: 05/24/18  1603     History   Chief Complaint Chief Complaint  Patient presents with  . Numbness    HPI Jessica Robertson is a 53 y.o. female who past medical history significant for hypertension, type 2 diabetes, hyperlipidemia who presents today complaining of left arm numbness and tingling.  Patient reports that she can feel the pain in her third and fourth fingers. Patient reports that symptoms started about a week ago.  She has been ignoring her pain hoping to do self resolve.  She denies any chest pain, diaphoresis, radiation to jaw, nausea or vomiting.  Patient has been at her baseline health.  She has not taken any medication for the pain.  She has been adherent to her blood pressure and diabetes medication.  Patient denies any history of shoulder or neck pain/arthritis.  No recent injury or trauma.  Patient denies any headaches, vision change, dizziness, weakness, slurred speech or confusion associated with her symptoms.  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  No heavy lifting or manual work.  HPI  Past Medical History:  Diagnosis Date  . Diabetes mellitus without complication (HCC)   . Gout   . Hyperlipemia   . Hypertension   . Plantar fasciitis     Patient Active Problem List   Diagnosis Date Noted  . Controlled type 2 diabetes mellitus with hyperglycemia (HCC) 06/10/2016  . Acute pancreatitis 06/09/2016  . Hypertension   . Hyperlipemia   . Acute Cholecystitis s/p lap cholecystectomy 12/10/2015 12/10/2015    Past Surgical History:  Procedure Laterality Date  . ablation    . CHOLECYSTECTOMY N/A 12/11/2015   Procedure: LAPAROSCOPIC CHOLECYSTECTOMY WITH INTRAOPERATIVE CHOLANGIOGRAM;  Surgeon: Luretha MurphyMatthew Martin, MD;  Location: [facility];  Service: General;  Laterality: N/A;  . LYMPH NODE DISSECTION    . TUBAL LIGATION       OB History   No obstetric history on  file.      Home Medications    Prior to Admission medications   Medication Sig Start Date End Date Taking? Authorizing Provider  amLODipine (NORVASC) 10 MG tablet Take 1 tablet (10 mg total) by mouth daily. 06/13/16   Rai, Delene Ruffiniipudeep K, MD  atorvastatin (LIPITOR) 20 MG tablet Take 20 mg by mouth daily.    [provider]  benzonatate (TESSALON) 100 MG capsule Take 1 capsule (100 mg total) by mouth 3 (three) times daily as needed for cough. 02/02/18   Tilden Fossaees, Elizabeth, MD  ibuprofen (ADVIL,MOTRIN) 400 MG tablet Take 1 tablet (400 mg total) by mouth every 6 (six) hours as needed. 08/02/17   Mesner, Barbara CowerJason, MD  lisinopril (PRINIVIL,ZESTRIL) 40 MG tablet Take by mouth. 02/02/16   [provider]  metFORMIN (GLUCOPHAGE) 500 MG tablet Take 500 mg by mouth daily.     [provider]  methylPREDNISolone (MEDROL DOSEPAK) 4 MG TBPK tablet Take 4 pills for 3 days, followed by 3 pills for 2 days followed by 2 pills for 2 days 05/24/18   Lovena Neighboursiallo, Derryl Uher, MD  naproxen (NAPROSYN) 500 MG tablet Take 1 tablet (500 mg total) by mouth 2 (two) times daily. 06/22/17   Gwyneth SproutPlunkett, Whitney, MD    Family History Family History  Problem Relation Age of Onset  . Hypertension Other     Social History Social History   Tobacco Use  . Smoking status: Never Smoker  . Smokeless tobacco: Never Used  Substance Use Topics  . Alcohol use: No  . Drug use: No     Allergies   Patient has no known allergies.   Review of Systems Review of Systems  HENT: Negative.   Eyes: Negative.   Respiratory: Negative.   Cardiovascular: Negative.   Gastrointestinal: Negative.   Genitourinary: Negative.   Musculoskeletal: Negative.   Skin: Negative.   Neurological: Positive for numbness.    Physical Exam Updated Vital Signs BP (!) 170/93   Pulse 92   Temp 98.5 F (36.9 C) (Oral)   Resp (!) 23   SpO2 95%   Physical Exam Constitutional:      Appearance: She is normal weight.  HENT:      Head: Normocephalic and atraumatic.     Nose: Nose normal.     Mouth/Throat:     Mouth: Mucous membranes are moist.     Pharynx: Oropharynx is clear.  Eyes:     Conjunctiva/sclera: Conjunctivae normal.     Pupils: Pupils are equal, round, and reactive to light.  Neck:     Musculoskeletal: Normal range of motion and neck supple.  Cardiovascular:     Rate and Rhythm: Normal rate and regular rhythm.     Pulses: Normal pulses.     Heart sounds: Normal heart sounds.  Pulmonary:     Effort: Pulmonary effort is normal.     Breath sounds: Normal breath sounds.  Abdominal:     General: Abdomen is flat. Bowel sounds are normal.  Musculoskeletal: Normal range of motion.  Skin:    General: Skin is warm and dry.     Capillary Refill: Capillary refill takes less than 2 seconds.  Neurological:     General: No focal deficit present.     Mental Status: She is alert and oriented to person, place, and time.  Psychiatric:        Mood and Affect: Mood normal.      ED Treatments / Results  Labs (all labs ordered are listed, but only abnormal results are displayed) Labs Reviewed  COMPREHENSIVE METABOLIC PANEL - Abnormal; Notable for the following components:      Result Value   Glucose, Bld 125 (*)    Calcium 10.7 (*)    All other components within normal limits  TROPONIN I - Abnormal; Notable for the following components:   Troponin I 0.03 (*)    All other components within normal limits  CBC WITH DIFFERENTIAL/PLATELET    EKG EKG Interpretation  Date/Time:  Thursday May 24 2018 16:19:01 EST Ventricular Rate:  96 PR Interval:  142 QRS Duration: 100 QT Interval:  370 QTC Calculation: 467 R Axis:   37 Text Interpretation:  Normal sinus rhythm Septal infarct , age undetermined Abnormal ECG No significant change since last tracing Confirmed by Richardean CanalYao, David H [phone]([id number]) on 05/24/2018 4:37:52 PM   Radiology Dg Chest 2 View  Result Date: 05/24/2018 CLINICAL DATA:  LEFT UPPER extremity  numbness for 1 week. EXAM: CHEST - 2 VIEW COMPARISON:  02/02/2018 chest radiograph FINDINGS: Cardiomegaly noted. Mild peribronchial thickening is unchanged. There is no evidence of focal airspace disease, pulmonary edema, suspicious pulmonary nodule/mass, pleural effusion, or pneumothorax. No acute bony abnormalities are identified. IMPRESSION: Cardiomegaly without evidence of acute cardiopulmonary disease. Electronically Signed   By: Harmon PierJeffrey  Hu M.D.   On: 05/24/2018 18:00   Dg Cervical Spine Complete  Result Date: 05/24/2018 CLINICAL DATA:  LEFT arm numbness for 1 week. EXAM: CERVICAL SPINE - COMPLETE 4+ VIEW COMPARISON:  None. FINDINGS: No acute fracture, subluxation or prevertebral soft tissue swelling. Mild multilevel spondylosis noted. Mild LOWER cervical spine facet arthropathy identified. No focal bony lesions or definite bony foraminal narrowing. IMPRESSION: Very mild degenerative changes without other significant abnormality. Electronically Signed   By: Harmon Pier M.D.   On: 05/24/2018 18:01    Procedures Procedures (including critical care time)  Medications Ordered in ED Medications - No data to display   Initial Impression / Assessment and Plan / ED Course  I have reviewed the triage vital signs and the nursing notes.  Pertinent labs & imaging results that were available during my care of the patient were reviewed by me and considered in my medical decision making (see chart for details).   Patient is a 53 year old female who presents wit numbness and tingling for the past week.  Patient initially concerned for possible cardiac etiology.  On exam patient has full range of motion equivocal Tinel sign rest of exam is unremarkable.  EKG showed normal sinus rhythm no ST or T wave changes concerning for cardiac process.  Troponin was 0.03 unchanged from October.  No chest pain, abdominal pain, diaphoresis or other concerning symptoms associated with numbness and tingling in her left arm.   Differential also included cervical/shoulder arthritis with neuropathy.  Cervical x-ray showed multilevel degenerative changes with some spondylosis.  Symptoms could be secondary to changes in her neck.  Checks x-ray was unremarkable.  Patient has been tachycardic on Friday for many years she also consider carpal tunnel. Could also be secondary to vitamin deficiency.  Patient will be discharged on a Medrol dose pack and will refer to sports medicine for further work-up.  She will also follow-up with PCP for additional blood work such as B12 level and vitamin D.  Patient stable on discharge no cellulitis plan.  Final Clinical Impressions(s) / ED Diagnoses   Final diagnoses:  Numbness  Paresthesias    ED Discharge Orders         Ordered    methylPREDNISolone (MEDROL DOSEPAK) 4 MG TBPK tablet  Status:  Discontinued     05/24/18 1821    methylPREDNISolone (MEDROL DOSEPAK) 4 MG TBPK tablet     05/24/18 1822           Lovena Neighbours, MD 05/24/18 1847    Charlynne Pander, MD 05/26/18 [phone]

## 2018-05-24 NOTE — ED Triage Notes (Addendum)
C/o left UE numbness x 1 week-denies injury-NAD-steady gait

## 2018-05-24 NOTE — Discharge Instructions (Signed)
We will send you home with a steroid course that you will take as prescribed. We also will refer you to Dr. Pearletha ForgeHudnall, phone number has been provided. He is a orthopedic specialist and can help you.

## 2019-07-27 ENCOUNTER — Ambulatory Visit: Payer: Self-pay | Attending: Internal Medicine

## 2019-07-27 DIAGNOSIS — Z23 Encounter for immunization: Secondary | ICD-10-CM

## 2019-07-27 NOTE — Progress Notes (Signed)
   Covid-19 Vaccination Clinic  Name:  Jessica Robertson    MRN: [id number] DOB: 04/20/1966  07/27/2019  Jessica Robertson was observed post Covid-19 immunization for 15 minutes without incident. She was provided with Vaccine Information Sheet and instruction to access the V-Safe system.   Jessica Robertson was instructed to call 911 with any severe reactions post vaccine: Marland Kitchen Difficulty breathing  . Swelling of face and throat  . A fast heartbeat  . A bad rash all over body  . Dizziness and weakness   Immunizations Administered    Name Date Dose VIS Date Route   Moderna COVID-19 Vaccine 07/27/2019 10:56 AM 0.5 mL 03/26/2019 Intramuscular   Manufacturer: Moderna   Lot: [id number]   NDC: 15830-940-76

## 2019-08-24 ENCOUNTER — Ambulatory Visit: Payer: Self-pay | Attending: Critical Care Medicine

## 2019-08-24 DIAGNOSIS — Z23 Encounter for immunization: Secondary | ICD-10-CM

## 2019-08-24 NOTE — Progress Notes (Signed)
   Covid-19 Vaccination Clinic  Name:  Jessica Robertson    MRN: [id number] DOB: Apr 24, 1966  08/24/2019  Jessica Robertson was observed post Covid-19 immunization for 15 minutes without incident. She was provided with Vaccine Information Sheet and instruction to access the V-Safe system.   Jessica Robertson was instructed to call 911 with any severe reactions post vaccine: Marland Kitchen Difficulty breathing  . Swelling of face and throat  . A fast heartbeat  . A bad rash all over body  . Dizziness and weakness   Immunizations Administered    Name Date Dose VIS Date Route   Moderna COVID-19 Vaccine 08/24/2019 10:54 AM 0.5 mL 03/2019 Intramuscular   Manufacturer: Moderna   Lot: [id number]   NDC: 20802-233-61

## 2019-10-16 IMAGING — DX DG CHEST 2V
2 series · 2 of 2 positions shown · non-contrast
Comparison: 02/02/2018 chest radiograph

CLINICAL DATA: LEFT UPPER extremity numbness for 1 week.

EXAM:
CHEST - 2 VIEW

[chest pa]
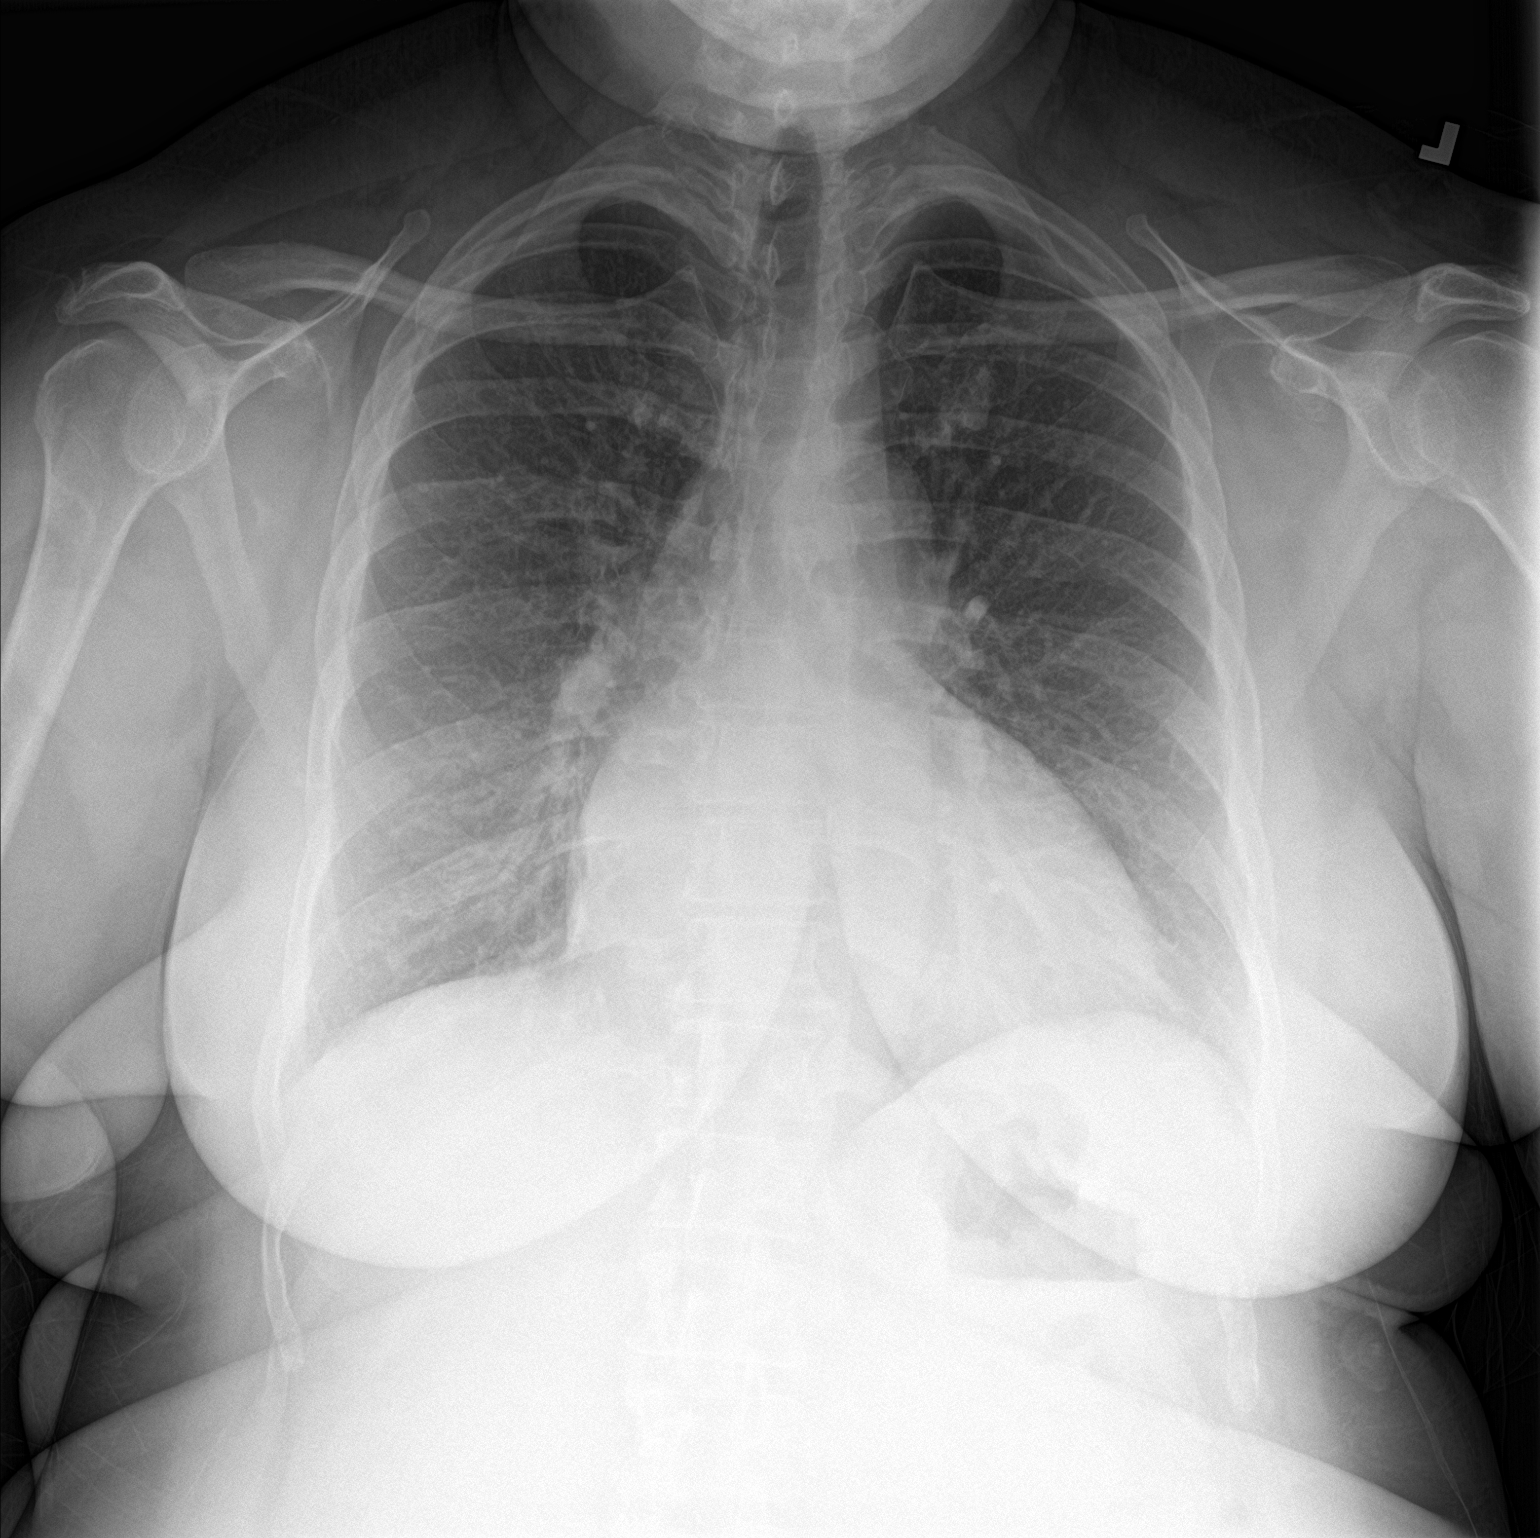

[chest lat]
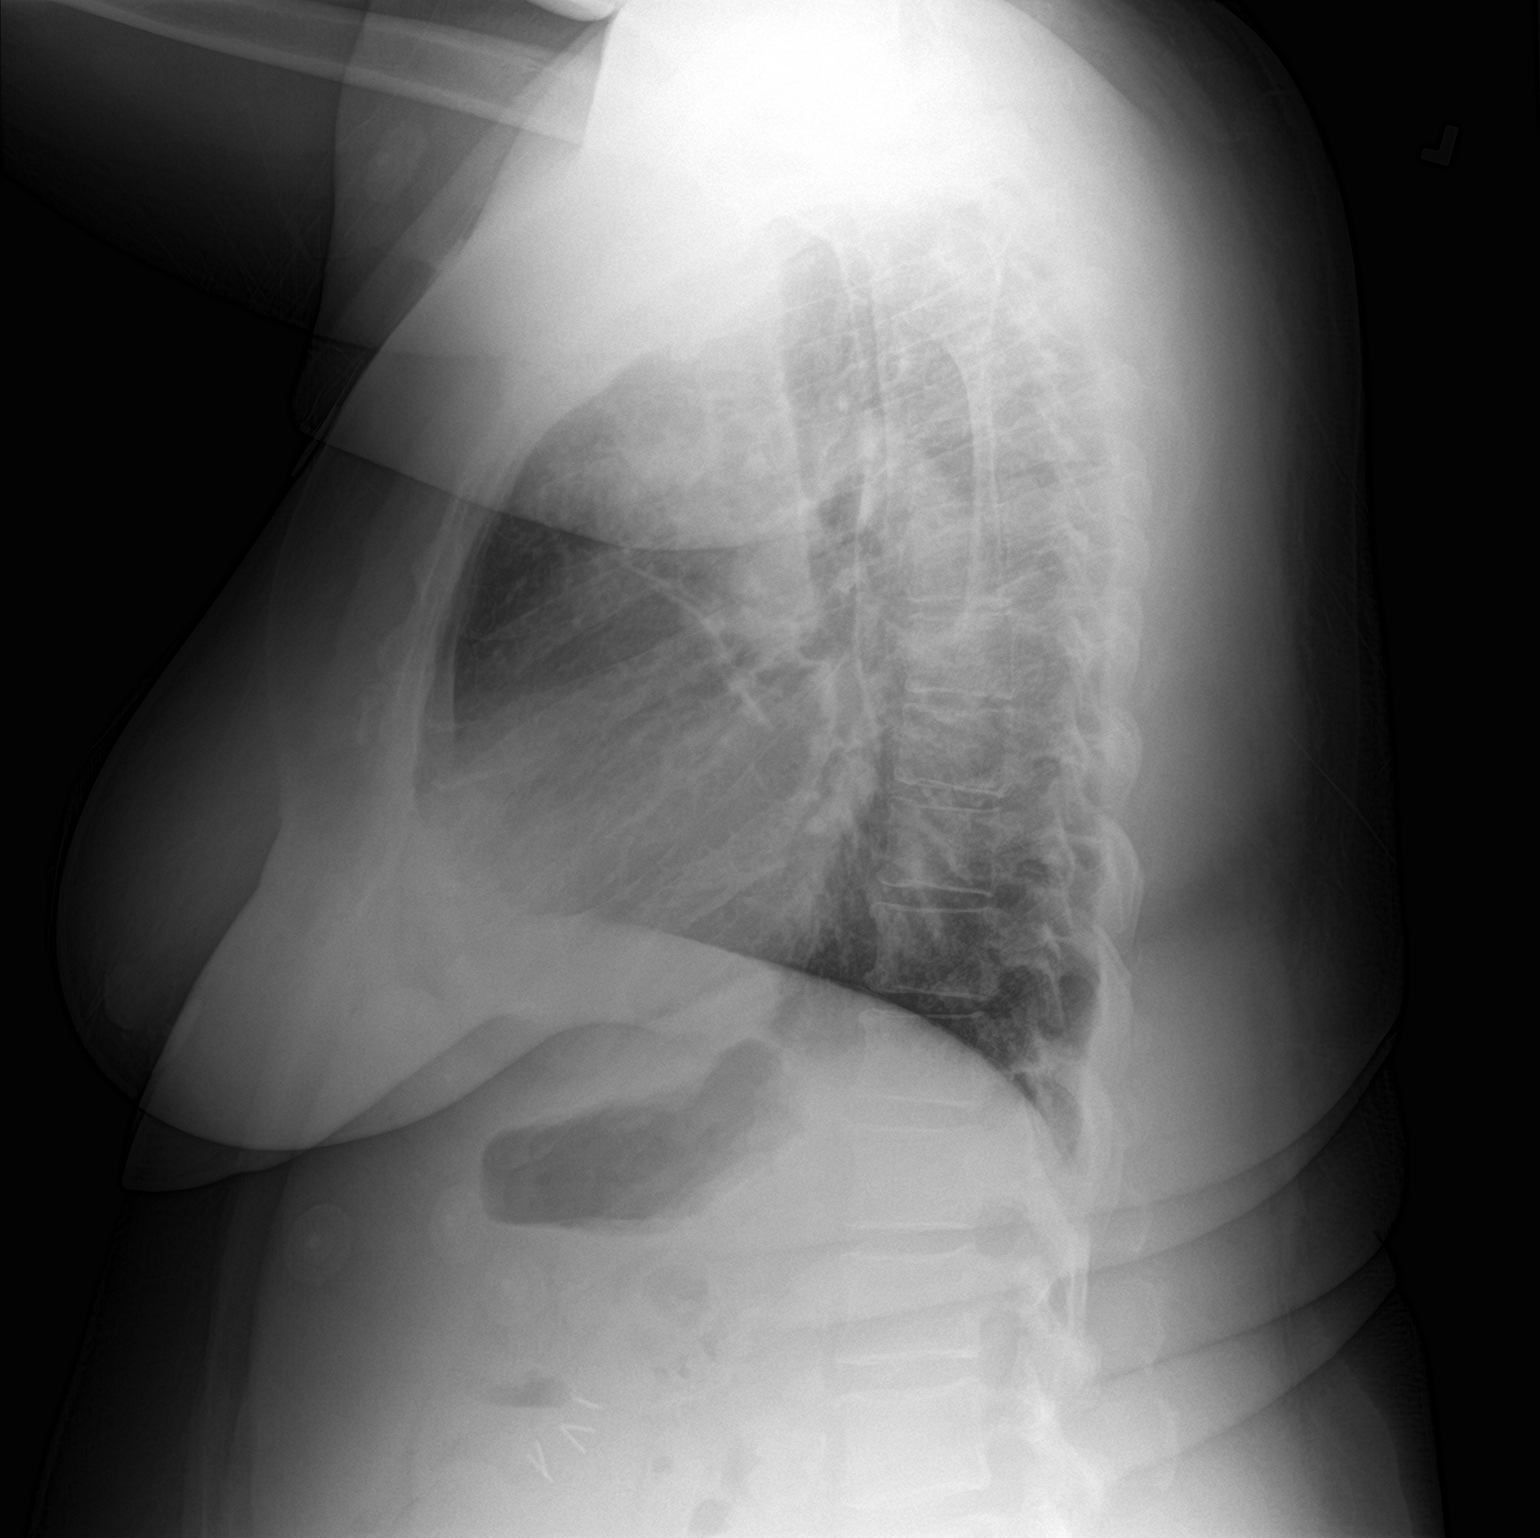

[2 of 2 positions shown; findings below may reference images not displayed]

FINDINGS: Cardiomegaly noted.

Mild peribronchial thickening is unchanged.

There is no evidence of focal airspace disease, pulmonary edema,
suspicious pulmonary nodule/mass, pleural effusion, or pneumothorax.

No acute bony abnormalities are identified.
IMPRESSION: Cardiomegaly without evidence of acute cardiopulmonary disease.

## 2019-10-16 IMAGING — DX DG CERVICAL SPINE COMPLETE 4+V
7 series · 7 of 7 positions shown · non-contrast
Comparison: None.

CLINICAL DATA: LEFT arm numbness for 1 week.

EXAM:
CERVICAL SPINE - COMPLETE 4+ VIEW

[c-spine lat]
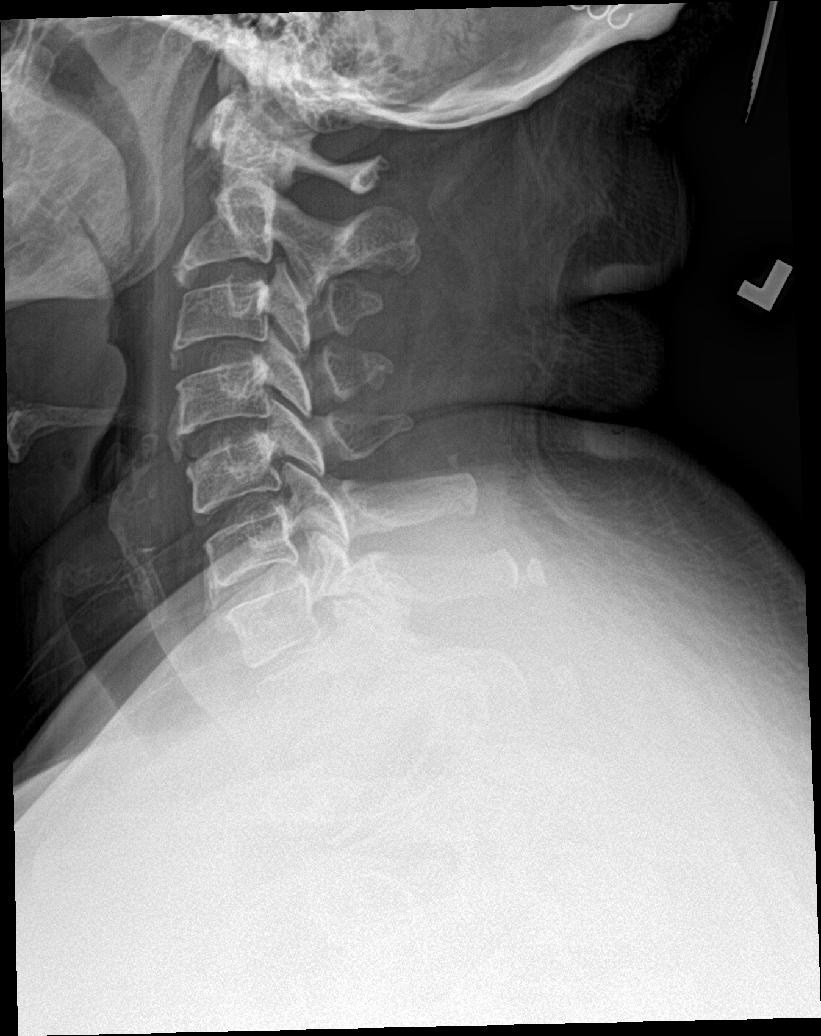

[c-spine obl (1 of 2)]
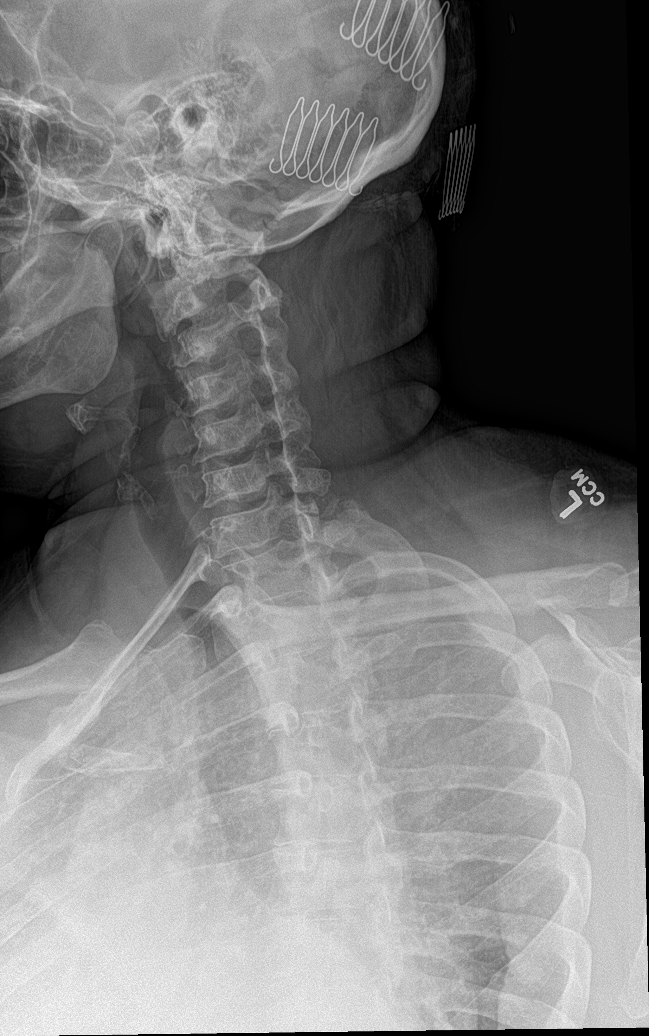

[c-spine ap]
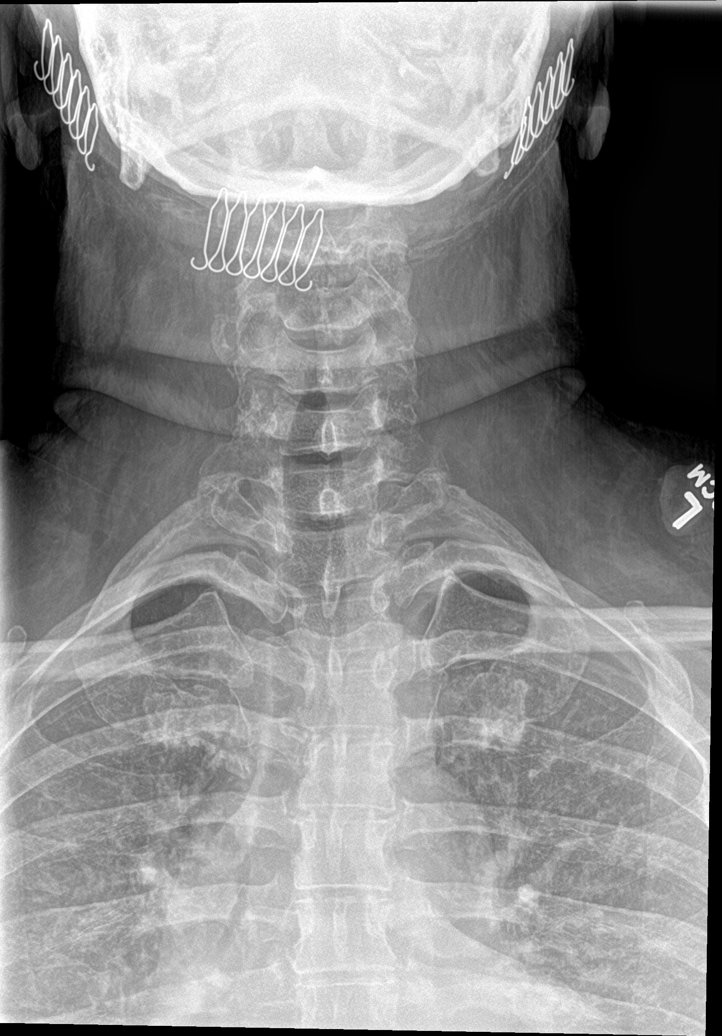

[c-spine open mouth (1 of 2)]
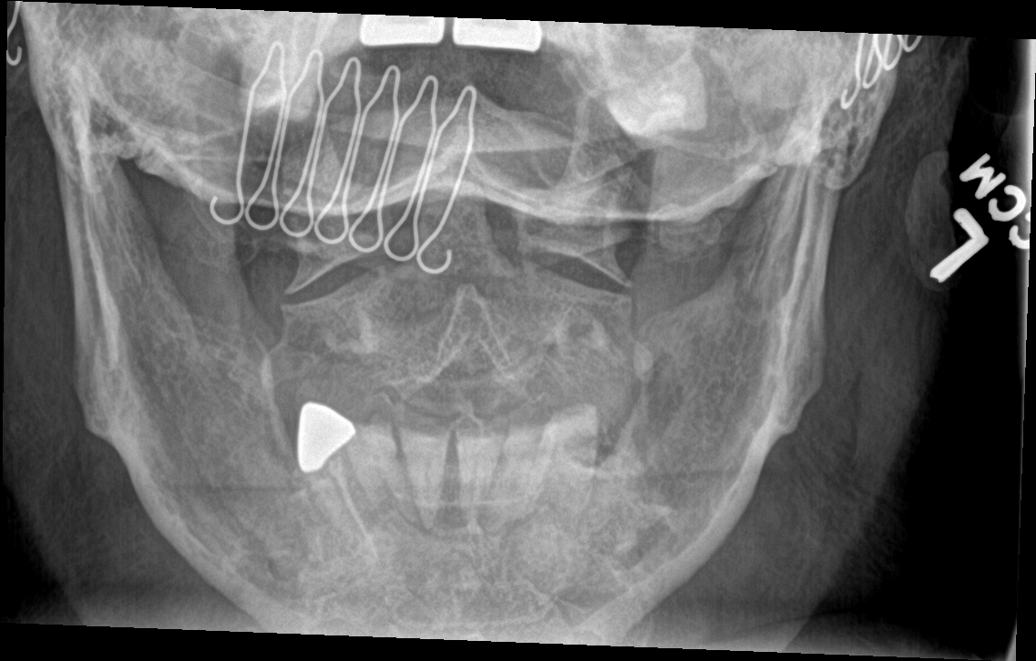

[[person_name]]
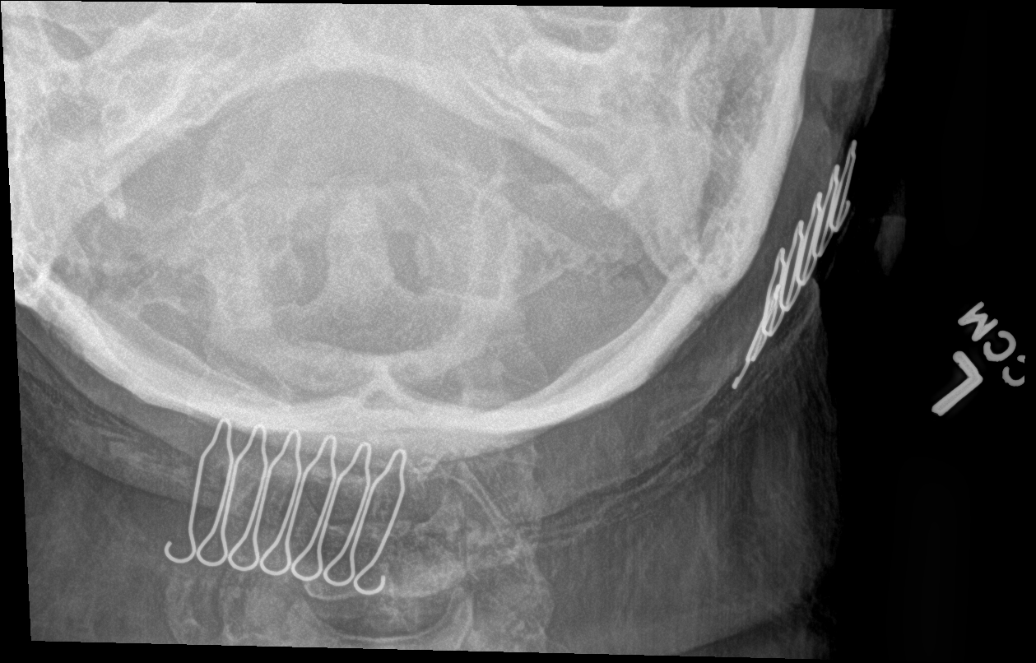

[c-spine obl (2 of 2)]
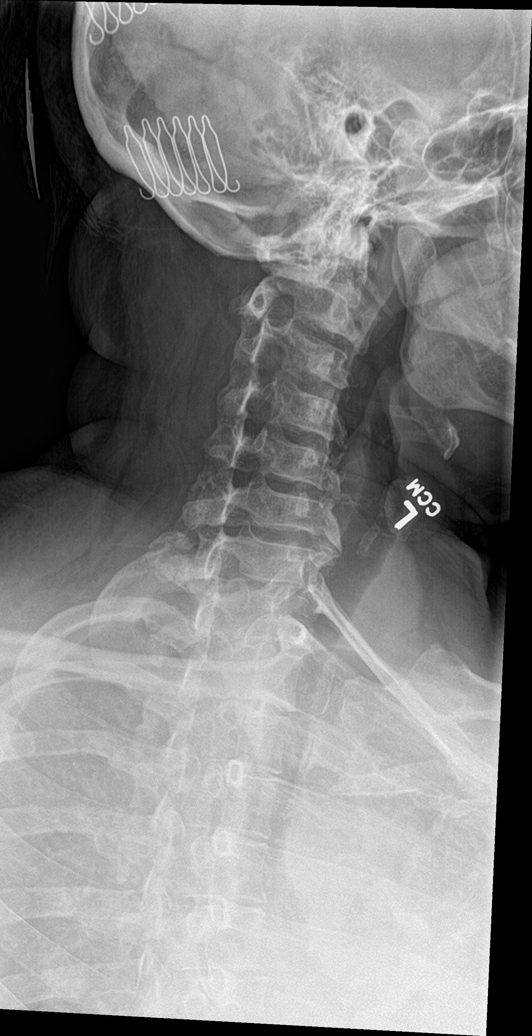

[c-spine open mouth (2 of 2)]
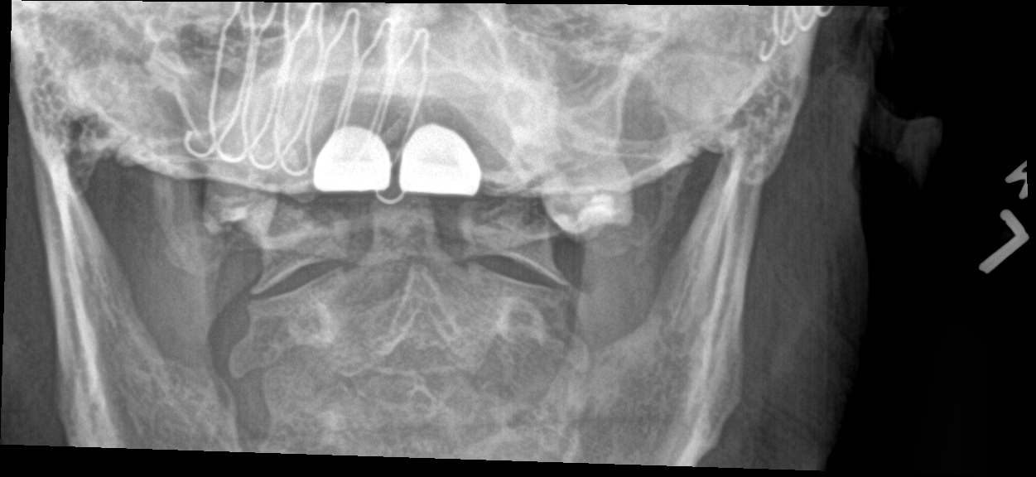

[7 of 7 positions shown; findings below may reference images not displayed]

FINDINGS: No acute fracture, subluxation or prevertebral soft tissue swelling.

Mild multilevel spondylosis noted.

Mild LOWER cervical spine facet arthropathy identified.

No focal bony lesions or definite bony foraminal narrowing.
IMPRESSION: Very mild degenerative changes without other significant
abnormality.

## 2020-09-14 ENCOUNTER — Ambulatory Visit: Payer: Self-pay

## 2020-09-14 NOTE — Progress Notes (Signed)
   Covid-19 Vaccination Clinic  Name:  Jessica Robertson    MRN: [id number] DOB: August 13, 1965  09/14/2020  [name] was observed post Covid-19 immunization for 15 minutes without incident. She was provided with Vaccine Information Sheet and instruction to access the V-Safe system.   [name] was instructed to call 911 with any severe reactions post vaccine: Marland Kitchen Difficulty breathing  . Swelling of face and throat  . A fast heartbeat  . A bad rash all over body  . Dizziness and weakness

## 2020-09-22 ENCOUNTER — Other Ambulatory Visit (HOSPITAL_BASED_OUTPATIENT_CLINIC_OR_DEPARTMENT_OTHER): Payer: Self-pay

## 2020-09-22 MED ORDER — MODERNA COVID-19 VACCINE 100 MCG/0.5ML IM SUSP
INTRAMUSCULAR | 0 refills | Status: AC
  Filled 2020-09-22: qty 0.25, 1d supply, fill #0

## 2021-09-13 ENCOUNTER — Encounter (HOSPITAL_BASED_OUTPATIENT_CLINIC_OR_DEPARTMENT_OTHER): Payer: Self-pay | Admitting: Emergency Medicine

## 2021-09-13 ENCOUNTER — Other Ambulatory Visit: Payer: Self-pay

## 2021-09-13 ENCOUNTER — Emergency Department (HOSPITAL_BASED_OUTPATIENT_CLINIC_OR_DEPARTMENT_OTHER)
Admission: EM | Admit: 2021-09-13 | Discharge: 2021-09-13 | Disposition: A | Discharge status: Home / Self Care | Payer: BC Managed Care – PPO | Attending: Emergency Medicine | Admitting: Emergency Medicine

## 2021-09-13 DIAGNOSIS — R590 Localized enlarged lymph nodes: Secondary | ICD-10-CM | POA: Diagnosis not present

## 2021-09-13 DIAGNOSIS — Z79899 Other long term (current) drug therapy: Secondary | ICD-10-CM | POA: Insufficient documentation

## 2021-09-13 DIAGNOSIS — I509 Heart failure, unspecified: Secondary | ICD-10-CM | POA: Diagnosis not present

## 2021-09-13 DIAGNOSIS — J0100 Acute maxillary sinusitis, unspecified: Secondary | ICD-10-CM | POA: Insufficient documentation

## 2021-09-13 DIAGNOSIS — R0602 Shortness of breath: Secondary | ICD-10-CM | POA: Diagnosis present

## 2021-09-13 MED ORDER — FLUTICASONE PROPIONATE 50 MCG/ACT NA SUSP
2.0000 | Freq: Every day | NASAL | Status: DC
  Administered 2021-09-13: 2 via NASAL
  Filled 2021-09-13: qty 16

## 2021-09-13 MED ORDER — CLINDAMYCIN HCL 150 MG PO CAPS
150.0000 mg | ORAL_CAPSULE | Freq: Once | ORAL | Status: AC
  Administered 2021-09-13: 150 mg via ORAL
  Filled 2021-09-13: qty 1

## 2021-09-13 MED ORDER — CLINDAMYCIN HCL 150 MG PO CAPS: 150.0000 mg | ORAL_CAPSULE | Freq: Three times a day (TID) | ORAL | 0 refills | Status: AC

## 2021-09-13 NOTE — ED Provider Notes (Signed)
[facility] EMERGENCY DEPARTMENT Provider Note   CSN: [id number] Arrival date & time: 09/13/21  0101     History  Chief Complaint  Patient presents with   Neck Swelling    Jessica Robertson is a 56 y.o. female.  The history is provided by the patient and medical records.  Jessica Robertson is a 56 y.o. female who presents to the Emergency Department complaining of difficulty breathing.  She had a root canal performed on May 12 to the ninth tooth.  Yesterday her crown came out.  She reports difficulty breathing since the procedure was performed.  She did not receive a sedation but only local analgesia.  Since the procedure was performed she has experienced facial congestion and she was initially prescribed amoxicillin.  Shortly after the procedure she developed swelling in the submental region as well as anterior neck.  On Thursday she went to urgent care and was given a medication-she is unaware of the name of this medication.  On Saturday morning and she returned to urgent care and was started on clindamycin 300 mg 3 times daily and received an antibiotic shot.  She did have a temperature to 99 on Thursday.  She complains of pain to her face, left greater than right.  She also has anterior neck discomfort.  She has pain in her left ear at times.  No difficulty swallowing, sore throat.  She feels like she has difficulty breathing due to nasal congestion.  She does have a history of CHF.     Home Medications Prior to Admission medications   Medication Sig Start Date End Date Taking? Authorizing Provider  clindamycin (CLEOCIN) 150 MG capsule Take 1 capsule (150 mg total) by mouth 3 (three) times daily. 09/13/21  Yes Tilden Fossa, MD  amLODipine (NORVASC) 10 MG tablet Take 1 tablet (10 mg total) by mouth daily. 06/13/16   Rai, Delene Ruffini, MD  atorvastatin (LIPITOR) 20 MG tablet Take 20 mg by mouth daily.    [provider]  benzonatate (TESSALON) 100 MG capsule Take 1 capsule  (100 mg total) by mouth 3 (three) times daily as needed for cough. 02/02/18   Tilden Fossa, MD  COVID-19 mRNA vaccine, Moderna, (MODERNA COVID-19 VACCINE) 100 MCG/0.5ML injection Inject into the muscle. 09/14/20   Judyann Munson, MD  ibuprofen (ADVIL,MOTRIN) 400 MG tablet Take 1 tablet (400 mg total) by mouth every 6 (six) hours as needed. 08/02/17   Mesner, Barbara Cower, MD  lisinopril (PRINIVIL,ZESTRIL) 40 MG tablet Take by mouth. 02/02/16   [provider]  metFORMIN (GLUCOPHAGE) 500 MG tablet Take 500 mg by mouth daily.     [provider]  methylPREDNISolone (MEDROL DOSEPAK) 4 MG TBPK tablet Take 4 pills for 3 days, followed by 3 pills for 2 days followed by 2 pills for 2 days 05/24/18   Lovena Neighbours, MD  naproxen (NAPROSYN) 500 MG tablet Take 1 tablet (500 mg total) by mouth 2 (two) times daily. 06/22/17   Gwyneth Sprout, MD      Allergies    Patient has no known allergies.    Review of Systems   Review of Systems  All other systems reviewed and are negative.  Physical Exam Updated Vital Signs BP 130/87   Pulse 79   Temp 98.9 F (37.2 C) (Oral)   Resp 18   Ht 5\' 4"  (1.626 m)   Wt 93 kg   SpO2 95%   BMI 35.19 kg/m  Physical Exam Vitals and nursing note reviewed.  Constitutional:  Appearance: She is well-developed.  HENT:     Head: Normocephalic and atraumatic.     Comments: Boggy and edematous nasal mucosa.  No edema in the oropharynx.  Mild ttp over the left maxilla.  Left TM wnl.  Right TM partially obscured by cerumen.  No elevation of the tongue.  Sublingual space is soft and nontender. Neck:     Comments: Full submental region without erythema or tenderness. Bilateral anterior cervical lymphadenopathy without erythema or edema.  Cardiovascular:     Rate and Rhythm: Normal rate and regular rhythm.     Heart sounds: No murmur heard. Pulmonary:     Effort: Pulmonary effort is normal. No respiratory distress.     Breath sounds: Normal breath  sounds.  Abdominal:     Palpations: Abdomen is soft.     Tenderness: There is no abdominal tenderness. There is no guarding or rebound.  Musculoskeletal:        General: No swelling or tenderness.  Skin:    General: Skin is warm and dry.  Neurological:     Mental Status: She is alert and oriented to person, place, and time.  Psychiatric:        Behavior: Behavior normal.    ED Results / Procedures / Treatments   Labs (all labs ordered are listed, but only abnormal results are displayed) Labs Reviewed - No data to display  EKG None  Radiology No results found.  Procedures Procedures    Medications Ordered in ED Medications  fluticasone (FLONASE) 50 MCG/ACT nasal spray 2 spray (2 sprays Each Nare Provided for home use 09/13/21 0322)  clindamycin (CLEOCIN) capsule 150 mg (150 mg Oral Given 09/13/21 7989)    ED Course/ Medical Decision Making/ A&P                           Medical Decision Making Risk Prescription drug management.   Patient with history of CHF here for evaluation of progressive nasal congestion/discomfort as well as swelling to her neck.  She is nontoxic-appearing on evaluation without respiratory distress.  She does have fullness of her submental region with some anterior cervical lymphadenopathy.  She has no stridor or evidence of Ludwigs angina, PTA, RPA.  She also has a boggy and edematous nasal mucosa.  Suspect that she has a sinusitis with reactive lymphadenopathy.  No evidence of sialoadenitis.  Recommend that she increase her clindamycin to 450 3 times daily.  Discussed OTC interventions that she can use to help with her nasal congestion such as Afrin twice daily, Flonase, sinus rinses.  Discussed importance of outpatient follow-up as well as return precautions for progressive or new concerning symptoms.        Final Clinical Impression(s) / ED Diagnoses Final diagnoses:  Acute non-recurrent maxillary sinusitis  Anterior cervical  lymphadenopathy    Rx / DC Orders ED Discharge Orders          Ordered    clindamycin (CLEOCIN) 150 MG capsule  3 times daily        09/13/21 0313              Tilden Fossa, MD 09/13/21 [phone]

## 2021-09-13 NOTE — Discharge Instructions (Addendum)
Use the afrin nasal spray twice daily for no more than three days.   You can purchase and use a sinus rinse pot, available over the counter in the pharmacy section.    Take a total of 450 mg of clindamycin three times daily for a total of ten days.

## 2021-09-13 NOTE — ED Notes (Signed)
Pt discharged to home. Discharge instructions have been discussed with patient and/or family members. Pt verbally acknowledges understanding d/c instructions, and endorses comprehension to checkout at registration before leaving.  °

## 2021-09-13 NOTE — ED Triage Notes (Signed)
Pt arrives pov, slow gait c/o congestion and anterior neck swelling and tenderness. Pt had root canal 5/12

## 2022-03-12 ENCOUNTER — Emergency Department (HOSPITAL_BASED_OUTPATIENT_CLINIC_OR_DEPARTMENT_OTHER): Payer: BC Managed Care – PPO

## 2022-03-12 ENCOUNTER — Emergency Department (HOSPITAL_BASED_OUTPATIENT_CLINIC_OR_DEPARTMENT_OTHER)
Admission: EM | Admit: 2022-03-12 | Discharge: 2022-03-12 | Disposition: A | Discharge status: Home / Self Care | Payer: BC Managed Care – PPO | Attending: Emergency Medicine | Admitting: Emergency Medicine

## 2022-03-12 ENCOUNTER — Encounter (HOSPITAL_BASED_OUTPATIENT_CLINIC_OR_DEPARTMENT_OTHER): Payer: Self-pay | Admitting: Emergency Medicine

## 2022-03-12 ENCOUNTER — Other Ambulatory Visit: Payer: Self-pay

## 2022-03-12 DIAGNOSIS — Z79899 Other long term (current) drug therapy: Secondary | ICD-10-CM | POA: Insufficient documentation

## 2022-03-12 DIAGNOSIS — M79605 Pain in left leg: Secondary | ICD-10-CM | POA: Diagnosis present

## 2022-03-12 DIAGNOSIS — M7122 Synovial cyst of popliteal space [Baker], left knee: Secondary | ICD-10-CM | POA: Diagnosis not present

## 2022-03-12 DIAGNOSIS — Z7984 Long term (current) use of oral hypoglycemic drugs: Secondary | ICD-10-CM | POA: Diagnosis not present

## 2022-03-12 DIAGNOSIS — I10 Essential (primary) hypertension: Secondary | ICD-10-CM | POA: Diagnosis not present

## 2022-03-12 DIAGNOSIS — E119 Type 2 diabetes mellitus without complications: Secondary | ICD-10-CM | POA: Diagnosis not present

## 2022-03-12 LAB — CBC WITH DIFFERENTIAL/PLATELET
Abs Immature Granulocytes: 0.03 10*3/uL (ref 0.00–0.07)
Basophils Absolute: 0 10*3/uL (ref 0.0–0.1)
Basophils Relative: 0 %
Eosinophils Absolute: 0.1 10*3/uL (ref 0.0–0.5)
Eosinophils Relative: 1 %
HCT: 40.3 % (ref 36.0–46.0)
Hemoglobin: 12.6 g/dL (ref 12.0–15.0)
Immature Granulocytes: 0 %
Lymphocytes Relative: 21 %
Lymphs Abs: 1.6 10*3/uL (ref 0.7–4.0)
MCH: 28.4 pg (ref 26.0–34.0)
MCHC: 31.3 g/dL (ref 30.0–36.0)
MCV: 90.8 fL (ref 80.0–100.0)
Monocytes Absolute: 0.7 10*3/uL (ref 0.1–1.0)
Monocytes Relative: 9 %
Neutro Abs: 5 10*3/uL (ref 1.7–7.7)
Neutrophils Relative %: 69 %
Platelets: 219 10*3/uL (ref 150–400)
RBC: 4.44 MIL/uL (ref 3.87–5.11)
RDW: 13.2 % (ref 11.5–15.5)
WBC: 7.4 10*3/uL (ref 4.0–10.5)
nRBC: 0 % (ref 0.0–0.2)

## 2022-03-12 LAB — COMPREHENSIVE METABOLIC PANEL
ALT: 26 U/L (ref 0–44)
AST: 24 U/L (ref 15–41)
Albumin: 3.7 g/dL (ref 3.5–5.0)
Alkaline Phosphatase: 64 U/L (ref 38–126)
Anion gap: 6 (ref 5–15)
BUN: 17 mg/dL (ref 6–20)
CO2: 24 mmol/L (ref 22–32)
Calcium: 10.2 mg/dL (ref 8.9–10.3)
Chloride: 109 mmol/L (ref 98–111)
Creatinine, Ser: 0.75 mg/dL (ref 0.44–1.00)
GFR, Estimated: >60 mL/min (ref >60)
Glucose, Bld: 135 mg/dL — ABNORMAL HIGH (ref 70–99)
Potassium: 3.5 mmol/L (ref 3.5–5.1)
Sodium: 139 mmol/L (ref 135–145)
Total Bilirubin: 0.5 mg/dL (ref 0.3–1.2)
Total Protein: 7.1 g/dL (ref 6.5–8.1)

## 2022-03-12 NOTE — ED Provider Notes (Signed)
[facility] EMERGENCY DEPARTMENT Provider Note   CSN: [id number] Arrival date & time: 03/12/22  0645     History  Chief Complaint  Patient presents with   Leg Pain    Jessica Robertson is a 56 y.o. female.  HPI     56yo female with history of DM, htn, hlpd, cardiomyopathy who presents with left leg pain.  Reports pain has been present for about one month and worsening. Cramping and pain to back of calf and behind knee.  It is worse with walking.   Had an XR done at [facility], told had some fluid and arthritis. Given prednisone.  NO hx of DVT, PE, recent travel/surgery, fam hx of blood clots.    No CP, dyspnea, cough, fever.   Has had nasal congestion that just began last night.    Past Medical History:  Diagnosis Date   Diabetes mellitus without complication (HCC)    Gout    Hyperlipemia    Hypertension    Plantar fasciitis      Home Medications Prior to Admission medications   Medication Sig Start Date End Date Taking? Authorizing Provider  amLODipine (NORVASC) 10 MG tablet Take 1 tablet (10 mg total) by mouth daily. 06/13/16   Rai, Delene Ruffini, MD  atorvastatin (LIPITOR) 20 MG tablet Take 20 mg by mouth daily.    [provider]  benzonatate (TESSALON) 100 MG capsule Take 1 capsule (100 mg total) by mouth 3 (three) times daily as needed for cough. 02/02/18   Tilden Fossa, MD  clindamycin (CLEOCIN) 150 MG capsule Take 1 capsule (150 mg total) by mouth 3 (three) times daily. 09/13/21   Tilden Fossa, MD  COVID-19 mRNA vaccine, Moderna, (MODERNA COVID-19 VACCINE) 100 MCG/0.5ML injection Inject into the muscle. 09/14/20   Judyann Munson, MD  ibuprofen (ADVIL,MOTRIN) 400 MG tablet Take 1 tablet (400 mg total) by mouth every 6 (six) hours as needed. 08/02/17   Mesner, Barbara Cower, MD  lisinopril (PRINIVIL,ZESTRIL) 40 MG tablet Take by mouth. 02/02/16   [provider]  metFORMIN (GLUCOPHAGE) 500 MG tablet Take 500 mg by mouth daily.     [provider]  methylPREDNISolone (MEDROL DOSEPAK) 4 MG TBPK tablet Take 4 pills for 3 days, followed by 3 pills for 2 days followed by 2 pills for 2 days 05/24/18   Lovena Neighbours, MD  naproxen (NAPROSYN) 500 MG tablet Take 1 tablet (500 mg total) by mouth 2 (two) times daily. 06/22/17   Gwyneth Sprout, MD      Allergies    Patient has no known allergies.    Review of Systems   Review of Systems  Physical Exam Updated Vital Signs BP 134/87   Pulse 80   Temp 98.4 F (36.9 C)   Resp 16   Ht 5\' 4"  (1.626 m)   Wt 93 kg   SpO2 97%   BMI 35.19 kg/m  Physical Exam Vitals and nursing note reviewed.  Constitutional:      General: She is not in acute distress.    Appearance: Normal appearance. She is not ill-appearing, toxic-appearing or diaphoretic.  HENT:     Head: Normocephalic.     Nose: Congestion present.  Eyes:     Conjunctiva/sclera: Conjunctivae normal.  Cardiovascular:     Rate and Rhythm: Normal rate and regular rhythm.     Pulses: Normal pulses.  Pulmonary:     Effort: Pulmonary effort is normal. No respiratory distress.  Musculoskeletal:  General: Tenderness (left calf, behind knee) present. No deformity or signs of injury.     Cervical back: No rigidity.  Skin:    General: Skin is warm and dry.     Coloration: Skin is not jaundiced or pale.  Neurological:     General: No focal deficit present.     Mental Status: She is alert and oriented to person, place, and time.     ED Results / Procedures / Treatments   Labs (all labs ordered are listed, but only abnormal results are displayed) Labs Reviewed  COMPREHENSIVE METABOLIC PANEL - Abnormal; Notable for the following components:      Result Value   Glucose, Bld 135 (*)    All other components within normal limits  CBC WITH DIFFERENTIAL/PLATELET    EKG None  Radiology US Venous Img Lower Unilateral Left  Result Date: 03/12/2022 CLINICAL DATA:  56 year old female with acute LEFT LOWER  extremity pain and swelling. EXAM: LEFT LOWER EXTREMITY VENOUS DOPPLER ULTRASOUND TECHNIQUE: Gray-scale sonography with compression, as well as color and duplex ultrasound, were performed to evaluate the deep venous system(s) from the level of the common femoral vein through the popliteal and proximal calf veins. COMPARISON:  None Available. FINDINGS: VENOUS Normal compressibility of the LEFT common femoral, superficial femoral, and popliteal veins, as well as the visualized calf veins. Visualized portions of profunda femoral vein and great saphenous vein unremarkable. No filling defects to suggest DVT on grayscale or color Doppler imaging. Doppler waveforms show normal direction of venous flow, normal respiratory plasticity and response to augmentation. Limited views of the RIGHT common femoral vein are unremarkable. OTHER A 0.9 x 2.3 x 4.3 cm popliteal/Baker's cyst is noted. Limitations: none IMPRESSION: 1. No evidence of LEFT LOWER extremity DVT. 2. 4.3 cm LEFT popliteal/Baker's cyst. Electronically Signed   By: Harmon Pier M.D.   On: 03/12/2022 09:16    Procedures Procedures    Medications Ordered in ED Medications - No data to display  ED Course/ Medical Decision Making/ A&P                            [phone] female with history of DM, htn, hlpd, cardiomyopathy who presents with left leg pain.  DDx includes DVT, electrolyte abnormality, baker's cyst.  Normal pulses bilaterally, no sign of acute arterial thrombus. Doubt septic arthritis with no fever, good ROM knee.   Labs obtained to screen for electrolyte abnormalities, glucose abn in setting of steroids, and obtain baseline in case DVT found.   Labs personally evaluated by me and show no leukocytosis, no anemia, normal K, no other significant abnormaliteis.  DVT US shows no DVT, does show Baker's cyst.  Recommend continued supportive care, outpatient follow up with PCP and may follow up with Orthopedics as desired.         Final  Clinical Impression(s) / ED Diagnoses Final diagnoses:  Baker's cyst of knee, left    Rx / DC Orders ED Discharge Orders     None         Alvira Monday, MD 03/12/22 2229

## 2022-03-12 NOTE — ED Notes (Signed)
US at bedside

## 2022-03-12 NOTE — ED Triage Notes (Signed)
Pt states she has a knot in the back of her left calf area   States she went to [facility] on Sunday and was put on prednisone for 5 days  Pt states her leg is swollen and painful

## 2023-04-28 ENCOUNTER — Other Ambulatory Visit: Payer: Self-pay

## 2023-04-28 ENCOUNTER — Encounter (HOSPITAL_BASED_OUTPATIENT_CLINIC_OR_DEPARTMENT_OTHER): Payer: Self-pay

## 2023-04-28 ENCOUNTER — Emergency Department (HOSPITAL_BASED_OUTPATIENT_CLINIC_OR_DEPARTMENT_OTHER)
Admission: EM | Admit: 2023-04-28 | Discharge: 2023-04-28 | Discharge status: Against Medical Advice | Payer: BC Managed Care – PPO | Attending: Emergency Medicine | Admitting: Emergency Medicine

## 2023-04-28 DIAGNOSIS — R42 Dizziness and giddiness: Secondary | ICD-10-CM | POA: Insufficient documentation

## 2023-04-28 DIAGNOSIS — Z5321 Procedure and treatment not carried out due to patient leaving prior to being seen by health care provider: Secondary | ICD-10-CM | POA: Insufficient documentation

## 2023-04-28 LAB — URINALYSIS, ROUTINE W REFLEX MICROSCOPIC
Bilirubin Urine: NEGATIVE
Glucose, UA: >=500 mg/dL — AB
Hgb urine dipstick: NEGATIVE
Ketones, ur: NEGATIVE mg/dL
Leukocytes,Ua: NEGATIVE
Nitrite: NEGATIVE
Protein, ur: 30 mg/dL — AB
Specific Gravity, Urine: 1.02 (ref 1.005–1.030)
pH: 5.5 (ref 5.0–8.0)

## 2023-04-28 LAB — URINALYSIS, MICROSCOPIC (REFLEX)

## 2023-04-28 LAB — CBC
HCT: 45.6 % (ref 36.0–46.0)
Hemoglobin: 14.2 g/dL (ref 12.0–15.0)
MCH: 28.1 pg (ref 26.0–34.0)
MCHC: 31.1 g/dL (ref 30.0–36.0)
MCV: 90.1 fL (ref 80.0–100.0)
Platelets: 229 10*3/uL (ref 150–400)
RBC: 5.06 MIL/uL (ref 3.87–5.11)
RDW: 13.1 % (ref 11.5–15.5)
WBC: 5.3 10*3/uL (ref 4.0–10.5)
nRBC: 0 % (ref 0.0–0.2)

## 2023-04-28 LAB — BASIC METABOLIC PANEL
Anion gap: 7 (ref 5–15)
BUN: 18 mg/dL (ref 6–20)
CO2: 23 mmol/L (ref 22–32)
Calcium: 11.4 mg/dL — ABNORMAL HIGH (ref 8.9–10.3)
Chloride: 107 mmol/L (ref 98–111)
Creatinine, Ser: 0.84 mg/dL (ref 0.44–1.00)
GFR, Estimated: >60 mL/min (ref >60)
Glucose, Bld: 164 mg/dL — ABNORMAL HIGH (ref 70–99)
Potassium: 3.9 mmol/L (ref 3.5–5.1)
Sodium: 137 mmol/L (ref 135–145)

## 2023-04-28 LAB — PREGNANCY, URINE: Preg Test, Ur: NEGATIVE

## 2023-04-28 LAB — CBG MONITORING, ED: Glucose-Capillary: 152 mg/dL — ABNORMAL HIGH (ref 70–99)

## 2023-04-28 NOTE — ED Triage Notes (Signed)
 The patient gets dizzy when she stand up. No pain.

## 2024-02-26 ENCOUNTER — Encounter (HOSPITAL_BASED_OUTPATIENT_CLINIC_OR_DEPARTMENT_OTHER): Payer: Self-pay | Admitting: Emergency Medicine

## 2024-02-26 ENCOUNTER — Emergency Department (HOSPITAL_BASED_OUTPATIENT_CLINIC_OR_DEPARTMENT_OTHER)

## 2024-02-26 ENCOUNTER — Other Ambulatory Visit: Payer: Self-pay

## 2024-02-26 ENCOUNTER — Other Ambulatory Visit (HOSPITAL_BASED_OUTPATIENT_CLINIC_OR_DEPARTMENT_OTHER): Payer: Self-pay

## 2024-02-26 ENCOUNTER — Emergency Department (HOSPITAL_BASED_OUTPATIENT_CLINIC_OR_DEPARTMENT_OTHER)
Admission: EM | Admit: 2024-02-26 | Discharge: 2024-02-26 | Disposition: A | Discharge status: Home / Self Care | Attending: Emergency Medicine | Admitting: Emergency Medicine

## 2024-02-26 DIAGNOSIS — M25511 Pain in right shoulder: Secondary | ICD-10-CM | POA: Diagnosis present

## 2024-02-26 DIAGNOSIS — I10 Essential (primary) hypertension: Secondary | ICD-10-CM | POA: Diagnosis not present

## 2024-02-26 DIAGNOSIS — E119 Type 2 diabetes mellitus without complications: Secondary | ICD-10-CM | POA: Diagnosis not present

## 2024-02-26 MED ORDER — TIZANIDINE HCL 4 MG PO TABS
4.0000 mg | ORAL_TABLET | Freq: Four times a day (QID) | ORAL | 0 refills | Status: AC | PRN
  Filled 2024-02-26: qty 30, 8d supply, fill #0

## 2024-02-26 MED ORDER — MELOXICAM 15 MG PO TABS
15.0000 mg | ORAL_TABLET | Freq: Every day | ORAL | 0 refills | Status: AC
  Filled 2024-02-26: qty 7, 7d supply, fill #0

## 2024-02-26 NOTE — ED Triage Notes (Signed)
 Pt c/o R shoulder pain, radiating to neck x 3 day, progressively worse. Denies known injury, n/v/shob/diaphoresis.     Applied lidocaine  patch with no relief.

## 2024-02-26 NOTE — ED Provider Notes (Signed)
 Emergency Department Provider Note   I have reviewed the triage vital signs and the nursing notes.   HISTORY  Chief Complaint Shoulder Pain   HPI Jessica Robertson is a 58 y.o. female presents to the emergency department for evaluation of right shoulder pain.  Symptoms been present for the past 3 days.  She is having pain mainly to the posterior right shoulder radiating to the neck.  Pain does not radiate into the arm.  No chest pain.  No shortness of breath or diaphoresis.  No nausea or vomiting.  No numbness or tingling.    Past Medical History:  Diagnosis Date   Diabetes mellitus without complication (HCC)    Gout    Hyperlipemia    Hypertension    Plantar fasciitis     Review of Systems  Constitutional: No fever/chills Cardiovascular: Denies chest pain. Respiratory: Denies shortness of breath. Gastrointestinal: No abdominal pain.  No nausea, no vomiting.   Musculoskeletal: Positive right shoulder pain.  Skin: Negative for rash. Neurological: Negative for headaches, focal weakness or numbness.   ____________________________________________   PHYSICAL EXAM:  VITAL SIGNS: ED Triage Vitals  Encounter Vitals Group     BP 02/26/24 1156 114/67     Pulse Rate 02/26/24 1156 76     Resp 02/26/24 1156 17     Temp 02/26/24 1156 98.3 F (36.8 C)     Temp Source 02/26/24 1156 Oral     SpO2 02/26/24 1156 97 %     Weight --      Height 02/26/24 1155 5' 4 (1.626 m)   Constitutional: Alert and oriented. Well appearing and in no acute distress. Eyes: Conjunctivae are normal.  Head: Atraumatic. Nose: No congestion/rhinnorhea. Mouth/Throat: Mucous membranes are moist.   Neck: No stridor.   Cardiovascular: Normal rate, regular rhythm. Good peripheral circulation. Grossly normal heart sounds. 2+ radial pulse on the right.  Respiratory: Normal respiratory effort.  No retractions. Lungs CTAB. Gastrointestinal: Soft and nontender. No distention.  Musculoskeletal: Normal ROM  of the right shoulder. No crepitus. Soft forearm compartments.  Neurologic:  Normal speech and language. No gross focal neurologic deficits are appreciated.  Skin:  Skin is warm, dry and intact. No rash noted.  ____________________________________________  EKG   EKG Interpretation Date/Time:  Monday February 26 2024 11:59:50 EST Ventricular Rate:  74 PR Interval:  149 QRS Duration:  98 QT Interval:  399 QTC Calculation: 443 R Axis:   26  Text Interpretation: Sinus rhythm Anterior infarct, old Confirmed by Jessica Robertson [phone]) on 02/26/2024 12:09:24 PM       ____________________________________________   PROCEDURES  Procedure(s) performed:   Procedures  None  ____________________________________________   INITIAL IMPRESSION / ASSESSMENT AND PLAN / ED COURSE  Pertinent labs & imaging results that were available during my care of the patient were reviewed by me and considered in my medical decision making (see chart for details).   This patient is Presenting for Evaluation of shoulder pain, which does require a range of treatment options, and is a complaint that involves a moderate risk of morbidity and mortality.  The Differential Diagnoses include MSK strain, cervical radiculopathy, rotator cuff injury, labral injury, et.  Radiologic Tests Ordered, included shoulder XR. I independently interpreted the images and agree with radiology interpretation.   Medical Decision Making: Summary:  Patient presents emergency department right shoulder pain.  Soreness on exam.  Limb is neurovascularly intact.  No evidence of septic joint although considered.  X-ray imaging reassuring.  Plan for  Ortho follow-up with plan for meloxicam and zanaflex.   Patient's presentation is most consistent with acute, uncomplicated illness.   Disposition: discharge  ____________________________________________  FINAL CLINICAL IMPRESSION(S) / ED DIAGNOSES  Final diagnoses:  Acute pain of right  shoulder     NEW OUTPATIENT MEDICATIONS STARTED DURING THIS VISIT:  New Prescriptions   MELOXICAM (MOBIC) 15 MG TABLET    Take 1 tablet (15 mg total) by mouth daily for 7 days.   TIZANIDINE (ZANAFLEX) 4 MG TABLET    Take 1 tablet (4 mg total) by mouth every 6 (six) hours as needed for muscle spasms.    Note:  This document was prepared using Dragon voice recognition software and may include unintentional dictation errors.  Jessica Law, MD, [facility] Emergency Medicine    Jessica Robertson, Jessica MATSU, MD 02/26/24 1340

## 2024-02-26 NOTE — Discharge Instructions (Signed)

## 2024-02-26 NOTE — ED Notes (Signed)
 Pt alert and oriented X 4 at the time of discharge. RR even and unlabored. No acute distress noted. Pt verbalized understanding of discharge instructions as discussed. Pt ambulatory to lobby at time of discharge.
